# Patient Record
Sex: Female | Born: 1955 | Race: White | Hispanic: No | Marital: Married | Smoking: Former smoker
Health system: Southern US, Community
[De-identification: ages and names within clinical notes are randomized; demographics above are authoritative.]

## PROBLEM LIST (undated history)

## (undated) DIAGNOSIS — C801 Malignant (primary) neoplasm, unspecified: Secondary | ICD-10-CM

## (undated) DIAGNOSIS — R0600 Dyspnea, unspecified: Secondary | ICD-10-CM

## (undated) DIAGNOSIS — K219 Gastro-esophageal reflux disease without esophagitis: Secondary | ICD-10-CM

## (undated) DIAGNOSIS — M1990 Unspecified osteoarthritis, unspecified site: Secondary | ICD-10-CM

## (undated) DIAGNOSIS — F419 Anxiety disorder, unspecified: Secondary | ICD-10-CM

## (undated) DIAGNOSIS — I2510 Atherosclerotic heart disease of native coronary artery without angina pectoris: Secondary | ICD-10-CM

## (undated) HISTORY — PX: CARDIAC CATHETERIZATION: SHX172

## (undated) HISTORY — PX: OTHER SURGICAL HISTORY: SHX169

## (undated) HISTORY — PX: COLON SURGERY: SHX602

## (undated) HISTORY — PX: EYE SURGERY: SHX253

## (undated) HISTORY — PX: DILATION AND CURETTAGE OF UTERUS: SHX78

## (undated) HISTORY — PX: LIVER RESECTION: SHX1977

## (undated) HISTORY — PX: CHOLECYSTECTOMY: SHX55

---

## 2015-12-06 ENCOUNTER — Ambulatory Visit (INDEPENDENT_AMBULATORY_CARE_PROVIDER_SITE_OTHER): Payer: BLUE CROSS/BLUE SHIELD | Admitting: Orthopaedic Surgery

## 2015-12-06 ENCOUNTER — Ambulatory Visit (INDEPENDENT_AMBULATORY_CARE_PROVIDER_SITE_OTHER): Payer: Self-pay

## 2015-12-06 ENCOUNTER — Encounter (INDEPENDENT_AMBULATORY_CARE_PROVIDER_SITE_OTHER): Payer: Self-pay | Admitting: Orthopaedic Surgery

## 2015-12-06 DIAGNOSIS — M1612 Unilateral primary osteoarthritis, left hip: Secondary | ICD-10-CM | POA: Diagnosis not present

## 2015-12-06 DIAGNOSIS — M25552 Pain in left hip: Secondary | ICD-10-CM

## 2015-12-06 NOTE — Progress Notes (Signed)
Office Visit Note   Patient: [name]           Date of Birth: 1955/11/20           MRN: [id number] Visit Date: 12/06/2015              Requested by: Meliton Rattan, MD [address], [place] [id number] PCP: Meliton Rattan, MD   Assessment & Plan: Visit Diagnoses:  1. Pain in left hip   2. Unilateral primary osteoarthritis, left hip     Plan: [name] is definitely interested in a left total hip replacement. I showed her models and shaired with her her x-rays. We explained in detail the disease process. I had a thorough discussion of the risks and benefits of hip replacement surgery. Given her daily pain, her decreased mobility, and her decreased quality of life she does wish to have the surgery performed. We will work on getting this scheduled and then we would see her back in 2 weeks postoperative but no x-rays are needed.  Follow-Up Instructions: Return in about 1 month (around 01/05/2016) for 2 weeks post-op.   Orders:  Orders Placed This Encounter  Procedures  . XR HIP UNILAT W OR W/O PELVIS 1V LEFT   No orders of the defined types were placed in this encounter.     Procedures: No procedures performed   Clinical Data: No additional findings.   Subjective: Chief Complaint  Patient presents with  . Left Hip - Pain    Pain for a few years,worsening in the last 6 months. ROM is decreasing, pain worsens at night. Denies any groin pain. Has been told in the past she has arthritis in the hips.   Her pain is mainly at night. It does wake her up from sleep. It is 5 out of 10 on occasion. Definitely hurts in the groin area. Anti-inflammatories do help things calm down on occasion. HPI  Review of Systems Negative for chest pain shortness breath fever chills nausea and vomiting  Objective: Vital Signs: There were no vitals taken for this visit.  Physical Exam  Constitutional: She is oriented to person, place, and time. She appears  well-developed and well-nourished.  HENT:  Head: Normocephalic and atraumatic.  Eyes: EOM are normal. Pupils are equal, round, and reactive to light.  Neck: Normal range of motion. Neck supple.  Cardiovascular: Normal rate and regular rhythm.   Pulmonary/Chest: Effort normal and breath sounds normal.  Abdominal: Soft. Bowel sounds are normal.  Musculoskeletal:       Left hip: She exhibits decreased range of motion, decreased strength, tenderness and bony tenderness.  Neurological: She is alert and oriented to person, place, and time.  Skin: Skin is warm and dry.    Ortho Exam  Specialty Comments:  No specialty comments available.  Imaging: Xr Hip Unilat W Or W/o Pelvis 1v Left  Result Date: 12/06/2015 An AP pelvis and a lateral of her left hip show severe end-stage arthritis. There is joint space narrowing, periarticular osteophytes, and sclerotic changes. There is also cystic changes in the femoral head and the acetabulum.    PMFS History: There are no active problems to display for this patient.  No past medical history on file.  No family history on file.  No past surgical history on file. Social History   Occupational History  . Not on file.   Social History Main Topics  . Smoking status: Former Research scientist (life sciences)  . Smokeless tobacco: Never  Used  . Alcohol use Not on file  . Drug use: Unknown  . Sexual activity: Not on file

## 2016-01-09 ENCOUNTER — Other Ambulatory Visit (INDEPENDENT_AMBULATORY_CARE_PROVIDER_SITE_OTHER): Payer: Self-pay | Admitting: Physician Assistant

## 2016-01-09 ENCOUNTER — Encounter (HOSPITAL_COMMUNITY): Payer: Self-pay | Admitting: *Deleted

## 2016-01-09 ENCOUNTER — Encounter (HOSPITAL_COMMUNITY)
Admission: RE | Admit: 2016-01-09 | Discharge: 2016-01-09 | Disposition: A | Discharge status: Home / Self Care | Payer: BLUE CROSS/BLUE SHIELD | Source: Ambulatory Visit | Attending: Orthopaedic Surgery | Admitting: Orthopaedic Surgery

## 2016-01-09 DIAGNOSIS — Z01812 Encounter for preprocedural laboratory examination: Secondary | ICD-10-CM | POA: Diagnosis not present

## 2016-01-09 DIAGNOSIS — M1612 Unilateral primary osteoarthritis, left hip: Secondary | ICD-10-CM | POA: Insufficient documentation

## 2016-01-09 DIAGNOSIS — Z01818 Encounter for other preprocedural examination: Secondary | ICD-10-CM | POA: Diagnosis present

## 2016-01-09 HISTORY — DX: Anxiety disorder, unspecified: F41.9

## 2016-01-09 HISTORY — DX: Unspecified osteoarthritis, unspecified site: M19.90

## 2016-01-09 HISTORY — DX: Gastro-esophageal reflux disease without esophagitis: K21.9

## 2016-01-09 HISTORY — DX: Dyspnea, unspecified: R06.00

## 2016-01-09 HISTORY — DX: Malignant (primary) neoplasm, unspecified: C80.1

## 2016-01-09 HISTORY — DX: Atherosclerotic heart disease of native coronary artery without angina pectoris: I25.10

## 2016-01-09 LAB — COMPREHENSIVE METABOLIC PANEL
ALK PHOS: 67 U/L (ref 38–126)
ALT: 22 U/L (ref 14–54)
ANION GAP: 7 (ref 5–15)
AST: 24 U/L (ref 15–41)
Albumin: 4 g/dL (ref 3.5–5.0)
BILIRUBIN TOTAL: 0.9 mg/dL (ref 0.3–1.2)
BUN: 10 mg/dL (ref 6–20)
CALCIUM: 9.6 mg/dL (ref 8.9–10.3)
CO2: 26 mmol/L (ref 22–32)
Chloride: 105 mmol/L (ref 101–111)
Creatinine, Ser: 0.74 mg/dL (ref 0.44–1.00)
Glucose, Bld: 95 mg/dL (ref 65–99)
POTASSIUM: 3.8 mmol/L (ref 3.5–5.1)
Sodium: 138 mmol/L (ref 135–145)
TOTAL PROTEIN: 7 g/dL (ref 6.5–8.1)

## 2016-01-09 LAB — URINE MICROSCOPIC-ADD ON: RBC / HPF: NONE SEEN RBC/hpf (ref 0–5)

## 2016-01-09 LAB — URINALYSIS, ROUTINE W REFLEX MICROSCOPIC
Bilirubin Urine: NEGATIVE
GLUCOSE, UA: NEGATIVE mg/dL
Hgb urine dipstick: NEGATIVE
KETONES UR: NEGATIVE mg/dL
NITRITE: NEGATIVE
PROTEIN: NEGATIVE mg/dL
Specific Gravity, Urine: 1.007 (ref 1.005–1.030)
pH: 7 (ref 5.0–8.0)

## 2016-01-09 LAB — SURGICAL PCR SCREEN
MRSA, PCR: NEGATIVE
STAPHYLOCOCCUS AUREUS: POSITIVE — AB

## 2016-01-09 LAB — CBC
HEMATOCRIT: 41.8 % (ref 36.0–46.0)
HEMOGLOBIN: 13.3 g/dL (ref 12.0–15.0)
MCH: 27.2 pg (ref 26.0–34.0)
MCHC: 31.8 g/dL (ref 30.0–36.0)
MCV: 85.5 fL (ref 78.0–100.0)
Platelets: 282 10*3/uL (ref 150–400)
RBC: 4.89 MIL/uL (ref 3.87–5.11)
RDW: 15.3 % (ref 11.5–15.5)
WBC: 6.2 10*3/uL (ref 4.0–10.5)

## 2016-01-09 NOTE — Progress Notes (Signed)
Patient was diagnosed with rectal cancer back in 2009.  Had colon surgery, with part of her liver resected and now has ileostomy bag and a Port a Cath ( port was last flushed 03/2015)  All this happened in 2009-2010.  Last chemo treatment (12 in all) was 10/2008, and radiation was 02/2008.   She was experiencing some SOB with exertion, and went to see Dr. Ernesta Amble at [facility].  Have requested info from his office.  Otherwise, heart caths were normal. PCP is Leanne Bankitis @ [facility]  LOV 09/2015

## 2016-01-09 NOTE — Pre-Procedure Instructions (Signed)
    [name]  01/09/2016      [address], [place] [address] Phone: [phone] Fax: [phone]    Your procedure is scheduled on Tuesday, Dec. 12th   Report to [facility] Admitting at 10:30 AM             (posted surgery time 12:30 pm - 2:20 pm)   Call this number if you have problems the morning of surgery:  [phone]   Remember:  Do not eat food or drink liquids after midnight Monday.   Take these medicines the morning of surgery with A SIP OF WATER : NONE              4-5 DAYS PRIOR TO SURGERY, STOP TAKING HERBAL SUPPLEMENTS, VITAMINS, BLOOD THINNERS, ANTI-INFLAMMATORIES   Do not wear jewelry, make-up or nail polish.  Do not wear lotions, powders, or perfumes, or deoderant.   Do not shave 48 hours prior to surgery.    Do not bring valuables to the hospital.  [facility] is not responsible for any belongings or valuables.  Contacts, dentures or bridgework may not be worn into surgery.  Leave your suitcase in the car.  After surgery it may be brought to your room.  For patients admitted to the hospital, discharge time will be determined by your treatment team.  Please read over the following fact sheets that you were given. Pain Booklet, MRSA Information and Surgical Site Infection Prevention

## 2016-01-10 ENCOUNTER — Encounter (HOSPITAL_COMMUNITY): Payer: Self-pay

## 2016-01-10 NOTE — Progress Notes (Signed)
Anesthesia Chart Review: Patient is a 60 year old female scheduled for left THA, anterior approach on 01/17/16 by Dr. Jean Rosenthal.  History includes former smoker (quit '85), exertional dyspnea, anxiety, GERD, arthritis, stage IV rectal cancer s/p chemoradiation '09 and s/p low anterior resection and resection of liver mets, ileostomy '10, Port-a-cath, cholecystectomy '10. She had mild CAD (30% LAD, RCA) by 2016 cath.   PCP is Dr. Byrd Hesselbach Bankaitis at [facility].   Cardiologist is Dr. Ernesta Amble with [facility], last visit 12/27/15.  Meds include ASA 81 mg.  BP 121/74   Pulse (!) 58   Temp 36.4 C (Oral)   Resp 20   Ht 5\' 3"  (1.6 m)   Wt 199 lb 15.3 oz (90.7 kg)   SpO2 100%   BMI 35.42 kg/m   12/27/15 EKG ([facility]): NSR.  10/22/14 Cardiac Cath ([facility]; done following 10/12/14 positive ETT): Impressions:  1. No significant CAD (normal LM, normal LCX/Ramus, 30% proximal LAD, 30% ostium RCA). Right dominance.  2. Elevated LV filling pressures (LVEDP 17-20 mmHg).  3. Absent for aortic stenosis.  03/29/15 CT Chest/abd/pelvis ([facility]; Care Everywhere): Impression: 1. No evidence for metastatic disease in the chest, abdomen or pelvis.  Preoperative labs noted. UA trace leukocytes, but negative nitrites.   If no acute changes then I would anticipate that she could proceed as planned.  George Hugh [facility] Short Stay Center/Anesthesiology Phone [phone] 01/10/2016 4:20 PM

## 2016-01-16 MED ORDER — TRANEXAMIC ACID 1000 MG/10ML IV SOLN
1000.0000 mg | INTRAVENOUS | Status: AC
  Administered 2016-01-17: 1000 mg via INTRAVENOUS
  Filled 2016-01-16: qty 10

## 2016-01-16 MED ORDER — CEFAZOLIN SODIUM-DEXTROSE 2-4 GM/100ML-% IV SOLN
2.0000 g | INTRAVENOUS | Status: AC
  Administered 2016-01-17: 2 g via INTRAVENOUS
  Filled 2016-01-16: qty 100

## 2016-01-17 ENCOUNTER — Encounter (HOSPITAL_COMMUNITY)
Admission: RE | Disposition: A | Discharge status: Home Health | Payer: Self-pay | Source: Ambulatory Visit | Attending: Orthopaedic Surgery

## 2016-01-17 ENCOUNTER — Inpatient Hospital Stay (HOSPITAL_COMMUNITY): Payer: BLUE CROSS/BLUE SHIELD

## 2016-01-17 ENCOUNTER — Inpatient Hospital Stay (HOSPITAL_COMMUNITY): Payer: BLUE CROSS/BLUE SHIELD | Admitting: Vascular Surgery

## 2016-01-17 ENCOUNTER — Inpatient Hospital Stay (HOSPITAL_COMMUNITY)
Admission: RE | Admit: 2016-01-17 | Discharge: 2016-01-20 | DRG: 470 | Disposition: A | Discharge status: Home Health | Payer: BLUE CROSS/BLUE SHIELD | Source: Ambulatory Visit | Attending: Orthopaedic Surgery | Admitting: Orthopaedic Surgery

## 2016-01-17 ENCOUNTER — Encounter (HOSPITAL_COMMUNITY): Payer: Self-pay | Admitting: *Deleted

## 2016-01-17 ENCOUNTER — Inpatient Hospital Stay (HOSPITAL_COMMUNITY): Payer: BLUE CROSS/BLUE SHIELD | Admitting: Anesthesiology

## 2016-01-17 DIAGNOSIS — K219 Gastro-esophageal reflux disease without esophagitis: Secondary | ICD-10-CM | POA: Diagnosis present

## 2016-01-17 DIAGNOSIS — Z419 Encounter for procedure for purposes other than remedying health state, unspecified: Secondary | ICD-10-CM

## 2016-01-17 DIAGNOSIS — Z87891 Personal history of nicotine dependence: Secondary | ICD-10-CM

## 2016-01-17 DIAGNOSIS — Z85048 Personal history of other malignant neoplasm of rectum, rectosigmoid junction, and anus: Secondary | ICD-10-CM | POA: Diagnosis not present

## 2016-01-17 DIAGNOSIS — I2510 Atherosclerotic heart disease of native coronary artery without angina pectoris: Secondary | ICD-10-CM | POA: Diagnosis present

## 2016-01-17 DIAGNOSIS — Z7982 Long term (current) use of aspirin: Secondary | ICD-10-CM | POA: Diagnosis not present

## 2016-01-17 DIAGNOSIS — M1612 Unilateral primary osteoarthritis, left hip: Secondary | ICD-10-CM | POA: Diagnosis present

## 2016-01-17 DIAGNOSIS — Z96642 Presence of left artificial hip joint: Secondary | ICD-10-CM

## 2016-01-17 DIAGNOSIS — Z79899 Other long term (current) drug therapy: Secondary | ICD-10-CM

## 2016-01-17 HISTORY — PX: TOTAL HIP ARTHROPLASTY: SHX124

## 2016-01-17 SURGERY — ARTHROPLASTY, HIP, TOTAL, ANTERIOR APPROACH
Anesthesia: Spinal | Laterality: Left

## 2016-01-17 MED ORDER — CHLORHEXIDINE GLUCONATE 4 % EX LIQD: 60.0000 mL | Freq: Once | CUTANEOUS | Status: DC

## 2016-01-17 MED ORDER — METHOCARBAMOL 500 MG PO TABS
500.0000 mg | ORAL_TABLET | Freq: Four times a day (QID) | ORAL | Status: DC | PRN
  Administered 2016-01-17 – 2016-01-20 (×6): 500 mg via ORAL
  Filled 2016-01-17 (×6): qty 1

## 2016-01-17 MED ORDER — ONDANSETRON HCL 4 MG/2ML IJ SOLN
INTRAMUSCULAR | Status: AC
  Filled 2016-01-17: qty 2

## 2016-01-17 MED ORDER — ADULT MULTIVITAMIN W/MINERALS CH
1.0000 | ORAL_TABLET | Freq: Every evening | ORAL | Status: DC
  Administered 2016-01-17 – 2016-01-19 (×3): 1 via ORAL
  Filled 2016-01-17 (×3): qty 1

## 2016-01-17 MED ORDER — SODIUM CHLORIDE 0.9 % IV SOLN
INTRAVENOUS | Status: DC
  Administered 2016-01-17: 16:00:00 via INTRAVENOUS

## 2016-01-17 MED ORDER — METHOCARBAMOL 1000 MG/10ML IJ SOLN
500.0000 mg | Freq: Four times a day (QID) | INTRAVENOUS | Status: DC | PRN
  Filled 2016-01-17: qty 5

## 2016-01-17 MED ORDER — PHENYLEPHRINE HCL 10 MG/ML IJ SOLN
INTRAVENOUS | Status: DC | PRN
  Administered 2016-01-17: 25 ug/min via INTRAVENOUS

## 2016-01-17 MED ORDER — MIDAZOLAM HCL 2 MG/2ML IJ SOLN
INTRAMUSCULAR | Status: AC
  Filled 2016-01-17: qty 2

## 2016-01-17 MED ORDER — HYDROMORPHONE HCL 1 MG/ML IJ SOLN
0.2500 mg | INTRAMUSCULAR | Status: DC | PRN
  Administered 2016-01-17 (×2): 0.5 mg via INTRAVENOUS

## 2016-01-17 MED ORDER — PROPOFOL 10 MG/ML IV BOLUS
INTRAVENOUS | Status: AC
  Filled 2016-01-17: qty 20

## 2016-01-17 MED ORDER — SODIUM CHLORIDE 0.9 % IR SOLN
Status: DC | PRN
  Administered 2016-01-17: 3000 mL

## 2016-01-17 MED ORDER — MIDAZOLAM HCL 5 MG/5ML IJ SOLN
INTRAMUSCULAR | Status: DC | PRN
Start: 2016-01-17 — End: 2016-01-17
  Administered 2016-01-17: 2 mg via INTRAVENOUS

## 2016-01-17 MED ORDER — EPHEDRINE 5 MG/ML INJ
INTRAVENOUS | Status: AC
  Filled 2016-01-17: qty 10

## 2016-01-17 MED ORDER — PHENYLEPHRINE HCL 10 MG/ML IJ SOLN
INTRAMUSCULAR | Status: DC | PRN
  Administered 2016-01-17: 120 ug via INTRAVENOUS
  Administered 2016-01-17: 80 ug via INTRAVENOUS

## 2016-01-17 MED ORDER — FENTANYL CITRATE (PF) 100 MCG/2ML IJ SOLN
INTRAMUSCULAR | Status: DC | PRN
  Administered 2016-01-17: 50 ug via INTRAVENOUS

## 2016-01-17 MED ORDER — 0.9 % SODIUM CHLORIDE (POUR BTL) OPTIME
TOPICAL | Status: DC | PRN
  Administered 2016-01-17: 1000 mL

## 2016-01-17 MED ORDER — ACETAMINOPHEN 325 MG PO TABS
650.0000 mg | ORAL_TABLET | Freq: Four times a day (QID) | ORAL | Status: DC | PRN
  Administered 2016-01-18 – 2016-01-20 (×6): 650 mg via ORAL
  Filled 2016-01-17 (×7): qty 2

## 2016-01-17 MED ORDER — CEFAZOLIN IN D5W 1 GM/50ML IV SOLN
1.0000 g | Freq: Four times a day (QID) | INTRAVENOUS | Status: AC
  Administered 2016-01-17 – 2016-01-18 (×2): 1 g via INTRAVENOUS
  Filled 2016-01-17 (×2): qty 50

## 2016-01-17 MED ORDER — METOCLOPRAMIDE HCL 5 MG PO TABS: 5.0000 mg | ORAL_TABLET | Freq: Three times a day (TID) | ORAL | Status: DC | PRN

## 2016-01-17 MED ORDER — PROPOFOL 10 MG/ML IV BOLUS
INTRAVENOUS | Status: DC | PRN
  Administered 2016-01-17: 20 mg via INTRAVENOUS
  Administered 2016-01-17: 10 mg via INTRAVENOUS
  Administered 2016-01-17: 20 mg via INTRAVENOUS

## 2016-01-17 MED ORDER — ONDANSETRON HCL 4 MG/2ML IJ SOLN: 4.0000 mg | Freq: Four times a day (QID) | INTRAMUSCULAR | Status: DC | PRN

## 2016-01-17 MED ORDER — DOCUSATE SODIUM 100 MG PO CAPS
100.0000 mg | ORAL_CAPSULE | Freq: Two times a day (BID) | ORAL | Status: DC
  Administered 2016-01-17 – 2016-01-19 (×5): 100 mg via ORAL
  Filled 2016-01-17 (×5): qty 1

## 2016-01-17 MED ORDER — HYDROMORPHONE HCL 1 MG/ML IJ SOLN
INTRAMUSCULAR | Status: AC
  Filled 2016-01-17: qty 0.5

## 2016-01-17 MED ORDER — OXYCODONE HCL 5 MG PO TABS
5.0000 mg | ORAL_TABLET | ORAL | Status: DC | PRN
  Administered 2016-01-17 – 2016-01-20 (×19): 10 mg via ORAL
  Filled 2016-01-17 (×19): qty 2

## 2016-01-17 MED ORDER — PHENOL 1.4 % MT LIQD: 1.0000 | OROMUCOSAL | Status: DC | PRN

## 2016-01-17 MED ORDER — MENTHOL 3 MG MT LOZG: 1.0000 | LOZENGE | OROMUCOSAL | Status: DC | PRN

## 2016-01-17 MED ORDER — ACETAMINOPHEN 650 MG RE SUPP: 650.0000 mg | Freq: Four times a day (QID) | RECTAL | Status: DC | PRN

## 2016-01-17 MED ORDER — PROMETHAZINE HCL 25 MG/ML IJ SOLN: 6.2500 mg | INTRAMUSCULAR | Status: DC | PRN

## 2016-01-17 MED ORDER — ONDANSETRON HCL 4 MG/2ML IJ SOLN
INTRAMUSCULAR | Status: DC | PRN
  Administered 2016-01-17: 4 mg via INTRAVENOUS

## 2016-01-17 MED ORDER — HYDROMORPHONE HCL 2 MG/ML IJ SOLN
0.5000 mg | INTRAMUSCULAR | Status: DC | PRN
  Administered 2016-01-17 (×2): 0.5 mg via INTRAVENOUS
  Filled 2016-01-17 (×2): qty 1

## 2016-01-17 MED ORDER — ONDANSETRON HCL 4 MG PO TABS
4.0000 mg | ORAL_TABLET | Freq: Four times a day (QID) | ORAL | Status: DC | PRN
  Administered 2016-01-18: 4 mg via ORAL
  Filled 2016-01-17: qty 1

## 2016-01-17 MED ORDER — DIPHENHYDRAMINE HCL 12.5 MG/5ML PO ELIX: 12.5000 mg | ORAL_SOLUTION | ORAL | Status: DC | PRN

## 2016-01-17 MED ORDER — ALUM & MAG HYDROXIDE-SIMETH 200-200-20 MG/5ML PO SUSP: 30.0000 mL | ORAL | Status: DC | PRN

## 2016-01-17 MED ORDER — PHENYLEPHRINE 40 MCG/ML (10ML) SYRINGE FOR IV PUSH (FOR BLOOD PRESSURE SUPPORT)
PREFILLED_SYRINGE | INTRAVENOUS | Status: AC
  Filled 2016-01-17: qty 10

## 2016-01-17 MED ORDER — EPHEDRINE SULFATE 50 MG/ML IJ SOLN
INTRAMUSCULAR | Status: DC | PRN
  Administered 2016-01-17: 15 mg via INTRAVENOUS

## 2016-01-17 MED ORDER — PROPOFOL 500 MG/50ML IV EMUL
INTRAVENOUS | Status: DC | PRN
  Administered 2016-01-17: 75 ug/kg/min via INTRAVENOUS

## 2016-01-17 MED ORDER — FENTANYL CITRATE (PF) 100 MCG/2ML IJ SOLN
INTRAMUSCULAR | Status: AC
  Filled 2016-01-17: qty 2

## 2016-01-17 MED ORDER — ASPIRIN 81 MG PO CHEW
81.0000 mg | CHEWABLE_TABLET | Freq: Two times a day (BID) | ORAL | Status: DC
  Administered 2016-01-17 – 2016-01-19 (×5): 81 mg via ORAL
  Filled 2016-01-17 (×5): qty 1

## 2016-01-17 MED ORDER — LACTATED RINGERS IV SOLN
INTRAVENOUS | Status: DC | PRN
  Administered 2016-01-17 (×2): via INTRAVENOUS

## 2016-01-17 MED ORDER — METOCLOPRAMIDE HCL 5 MG/ML IJ SOLN: 5.0000 mg | Freq: Three times a day (TID) | INTRAMUSCULAR | Status: DC | PRN

## 2016-01-17 MED ORDER — LACTATED RINGERS IV SOLN
Freq: Once | INTRAVENOUS | Status: AC
  Administered 2016-01-17: 11:00:00 via INTRAVENOUS

## 2016-01-17 MED ORDER — LIDOCAINE 2% (20 MG/ML) 5 ML SYRINGE
INTRAMUSCULAR | Status: AC
  Filled 2016-01-17: qty 5

## 2016-01-17 SURGICAL SUPPLY — 50 items
BENZOIN TINCTURE PRP APPL 2/3 (GAUZE/BANDAGES/DRESSINGS) ×3 IMPLANT
BLADE SAW SGTL 18X1.27X75 (BLADE) ×2 IMPLANT
BLADE SAW SGTL 18X1.27X75MM (BLADE) ×1
BLADE SURG ROTATE 9660 (MISCELLANEOUS) IMPLANT
CAPT HIP TOTAL 2 ×3 IMPLANT
CELLS DAT CNTRL 66122 CELL SVR (MISCELLANEOUS) ×1 IMPLANT
CLOSURE WOUND 1/2 X4 (GAUZE/BANDAGES/DRESSINGS) ×1
COVER SURGICAL LIGHT HANDLE (MISCELLANEOUS) ×3 IMPLANT
DRAPE C-ARM 42X72 X-RAY (DRAPES) ×3 IMPLANT
DRAPE STERI IOBAN 125X83 (DRAPES) ×3 IMPLANT
DRAPE U-SHAPE 47X51 STRL (DRAPES) ×9 IMPLANT
DRSG AQUACEL AG ADV 3.5X10 (GAUZE/BANDAGES/DRESSINGS) ×3 IMPLANT
DURAPREP 26ML APPLICATOR (WOUND CARE) ×3 IMPLANT
ELECT BLADE 4.0 EZ CLEAN MEGAD (MISCELLANEOUS) ×3
ELECT BLADE 6.5 EXT (BLADE) IMPLANT
ELECT REM PT RETURN 9FT ADLT (ELECTROSURGICAL) ×3
ELECTRODE BLDE 4.0 EZ CLN MEGD (MISCELLANEOUS) ×1 IMPLANT
ELECTRODE REM PT RTRN 9FT ADLT (ELECTROSURGICAL) ×1 IMPLANT
FACESHIELD WRAPAROUND (MASK) ×6 IMPLANT
GLOVE BIOGEL PI IND STRL 8 (GLOVE) ×2 IMPLANT
GLOVE BIOGEL PI INDICATOR 8 (GLOVE) ×4
GLOVE ECLIPSE 8.0 STRL XLNG CF (GLOVE) ×3 IMPLANT
GLOVE ORTHO TXT STRL SZ7.5 (GLOVE) ×6 IMPLANT
GOWN STRL REUS W/ TWL LRG LVL3 (GOWN DISPOSABLE) ×2 IMPLANT
GOWN STRL REUS W/ TWL XL LVL3 (GOWN DISPOSABLE) ×2 IMPLANT
GOWN STRL REUS W/TWL LRG LVL3 (GOWN DISPOSABLE) ×4
GOWN STRL REUS W/TWL XL LVL3 (GOWN DISPOSABLE) ×4
HANDPIECE INTERPULSE COAX TIP (DISPOSABLE) ×2
KIT BASIN OR (CUSTOM PROCEDURE TRAY) ×3 IMPLANT
KIT ROOM TURNOVER OR (KITS) ×3 IMPLANT
MANIFOLD NEPTUNE II (INSTRUMENTS) ×3 IMPLANT
NS IRRIG 1000ML POUR BTL (IV SOLUTION) ×3 IMPLANT
PACK TOTAL JOINT (CUSTOM PROCEDURE TRAY) ×3 IMPLANT
PAD ARMBOARD 7.5X6 YLW CONV (MISCELLANEOUS) ×3 IMPLANT
RTRCTR WOUND ALEXIS 18CM MED (MISCELLANEOUS) ×3
SET HNDPC FAN SPRY TIP SCT (DISPOSABLE) ×1 IMPLANT
STAPLER VISISTAT 35W (STAPLE) IMPLANT
STRIP CLOSURE SKIN 1/2X4 (GAUZE/BANDAGES/DRESSINGS) ×2 IMPLANT
SUT ETHIBOND NAB CT1 #1 30IN (SUTURE) ×3 IMPLANT
SUT MNCRL AB 4-0 PS2 18 (SUTURE) ×3 IMPLANT
SUT VIC AB 0 CT1 27 (SUTURE) ×2
SUT VIC AB 0 CT1 27XBRD ANBCTR (SUTURE) ×1 IMPLANT
SUT VIC AB 1 CT1 27 (SUTURE) ×2
SUT VIC AB 1 CT1 27XBRD ANBCTR (SUTURE) ×1 IMPLANT
SUT VIC AB 2-0 CT1 27 (SUTURE) ×2
SUT VIC AB 2-0 CT1 TAPERPNT 27 (SUTURE) ×1 IMPLANT
TOWEL OR 17X24 6PK STRL BLUE (TOWEL DISPOSABLE) ×3 IMPLANT
TOWEL OR 17X26 10 PK STRL BLUE (TOWEL DISPOSABLE) ×3 IMPLANT
TRAY CATH 16FR W/PLASTIC CATH (SET/KITS/TRAYS/PACK) IMPLANT
TRAY FOLEY CATH 16FRSI W/METER (SET/KITS/TRAYS/PACK) ×3 IMPLANT

## 2016-01-17 NOTE — Anesthesia Postprocedure Evaluation (Signed)
Anesthesia Post Note  Patient: [name]  Procedure(s) Performed: Procedure(s) (LRB): LEFT TOTAL HIP ARTHROPLASTY ANTERIOR APPROACH (Left)  Patient location during evaluation: PACU Anesthesia Type: Spinal Level of consciousness: oriented and awake and alert Pain management: pain level controlled Vital Signs Assessment: post-procedure vital signs reviewed and stable Respiratory status: spontaneous breathing, respiratory function stable and patient connected to nasal cannula oxygen Cardiovascular status: blood pressure returned to baseline and stable Postop Assessment: no headache and no backache Anesthetic complications: no    Last Vitals:  Vitals:   01/17/16 1430 01/17/16 1445  BP: (!) 92/59 (!) 97/54  Pulse: 73 66  Resp: 19 16  Temp:      Last Pain:  Vitals:   01/17/16 1445  TempSrc:   PainSc: 0-No pain    LLE Motor Response: No movement due to regional block (01/17/16 1445) LLE Sensation: No sensation (absent) (01/17/16 1445) RLE Motor Response: No movement due to regional block (01/17/16 1445) RLE Sensation: No sensation (absent) (01/17/16 1445) L Sensory Level: L2-Upper inner thigh, upper buttock (01/17/16 1445) R Sensory Level: L2-Upper inner thigh, upper buttock (01/17/16 1445)  Oliver Heitzenrater S

## 2016-01-17 NOTE — Anesthesia Preprocedure Evaluation (Addendum)
Anesthesia Evaluation  Patient identified by MRN, date of birth, ID band Patient awake    Reviewed: Allergy & Precautions, NPO status , Patient's Chart, lab work & pertinent test results  Airway Mallampati: III  TM Distance: >3 FB Neck ROM: Full    Dental no notable dental hx. (+) Teeth Intact, Dental Advisory Given   Pulmonary neg pulmonary ROS, former smoker,    Pulmonary exam normal breath sounds clear to auscultation       Cardiovascular + CAD  negative cardio ROS Normal cardiovascular exam Rhythm:Regular Rate:Normal  Cath 10/2014 30% RCA/LAD   Neuro/Psych negative neurological ROS  negative psych ROS   GI/Hepatic Neg liver ROS, GERD  Medicated and Controlled,Hx colon cancer Patient currently has an ileostomy.   Endo/Other  negative endocrine ROS  Renal/GU negative Renal ROS  negative genitourinary   Musculoskeletal negative musculoskeletal ROS (+)   Abdominal   Peds negative pediatric ROS (+)  Hematology negative hematology ROS (+)   Anesthesia Other Findings   Reproductive/Obstetrics negative OB ROS                           Anesthesia Physical Anesthesia Plan  ASA: II  Anesthesia Plan: Spinal   Post-op Pain Management:    Induction: Intravenous  Airway Management Planned: Simple Face Mask  Additional Equipment:   Intra-op Plan:   Post-operative Plan:   Informed Consent: I have reviewed the patients History and Physical, chart, labs and discussed the procedure including the risks, benefits and alternatives for the proposed anesthesia with the patient or authorized representative who has indicated his/her understanding and acceptance.   Dental advisory given  Plan Discussed with: CRNA and Surgeon  Anesthesia Plan Comments:         Anesthesia Quick Evaluation

## 2016-01-17 NOTE — Brief Op Note (Signed)
01/17/2016  1:47 PM  PATIENT:  [name]  60 y.o. female  PRE-OPERATIVE DIAGNOSIS:  Osteoarthritis left hip  POST-OPERATIVE DIAGNOSIS:  Osteoarthritis left hip  PROCEDURE:  Procedure(s): LEFT TOTAL HIP ARTHROPLASTY ANTERIOR APPROACH (Left)  SURGEON:  Surgeon(s) and Role:    * Mcarthur Rossetti, MD - Primary  PHYSICIAN ASSISTANT: Benita Stabile, PA-C  ANESTHESIA:   spinal  EBL:  Total I/O In: 1000 [I.V.:1000] Out: 350 [Urine:150; Blood:200]  COUNTS:  YES  DICTATION: .Other Dictation: Dictation Number [id number]  PLAN OF CARE: Admit to inpatient   PATIENT DISPOSITION:  PACU - hemodynamically stable.   Delay start of Pharmacological VTE agent (>24hrs) due to surgical blood loss or risk of bleeding: no

## 2016-01-17 NOTE — Anesthesia Procedure Notes (Signed)
Procedure Name: MAC Date/Time: 01/17/2016 12:25 PM Performed by: Jenne Campus Pre-anesthesia Checklist: Patient identified, Emergency Drugs available, Suction available, Patient being monitored and Timeout performed Oxygen Delivery Method: Simple face mask

## 2016-01-17 NOTE — Transfer of Care (Signed)
Immediate Anesthesia Transfer of Care Note  Patient: [name]  Procedure(s) Performed: Procedure(s): LEFT TOTAL HIP ARTHROPLASTY ANTERIOR APPROACH (Left)  Patient Location: PACU  Anesthesia Type:MAC and Spinal  Level of Consciousness: awake, alert , oriented and patient cooperative  Airway & Oxygen Therapy: Patient Spontanous Breathing and Patient connected to face mask oxygen  Post-op Assessment: Report given to RN and Post -op Vital signs reviewed and stable  Post vital signs: Reviewed  Last Vitals:  Vitals:   01/17/16 1011 01/17/16 1420  BP: 129/83 (!) (P) 86/49  Pulse: 70   Resp: 20   Temp: 36.6 C (P) 36.5 C    Last Pain:  Vitals:   01/17/16 1034  TempSrc:   PainSc: 2          Complications: No apparent anesthesia complications

## 2016-01-17 NOTE — Anesthesia Procedure Notes (Signed)
Spinal  Patient location during procedure: OR Start time: 01/17/2016 12:25 PM End time: 01/17/2016 12:35 PM Staffing Anesthesiologist: Kenetra Hildenbrand, Iona Beard Performed: anesthesiologist  Preanesthetic Checklist Completed: patient identified, site marked, surgical consent, pre-op evaluation, timeout performed, IV checked, risks and benefits discussed and monitors and equipment checked Spinal Block Patient position: sitting Prep: Betadine Patient monitoring: heart rate, continuous pulse ox and blood pressure Injection technique: single-shot Needle Needle type: Sprotte  Needle gauge: 24 G Needle length: 9 cm Additional Notes Expiration date of kit checked and confirmed. Patient tolerated procedure well, without complications.

## 2016-01-17 NOTE — H&P (Signed)
TOTAL HIP ADMISSION H&P  Patient is admitted for left total hip arthroplasty.  Subjective:  Chief Complaint: left hip pain  HPI: [name], 60 y.o. female, has a history of pain and functional disability in the left hip(s) due to arthritis and patient has failed non-surgical conservative treatments for greater than 12 weeks to include NSAID's and/or analgesics, corticosteriod injections, flexibility and strengthening excercises, use of assistive devices, weight reduction as appropriate and activity modification.  Onset of symptoms was gradual starting 2 years ago with gradually worsening course since that time.The patient noted no past surgery on the left hip(s).  Patient currently rates pain in the left hip at 10 out of 10 with activity. Patient has night pain, worsening of pain with activity and weight bearing, pain that interfers with activities of daily living and pain with passive range of motion. Patient has evidence of subchondral cysts, subchondral sclerosis, periarticular osteophytes and joint space narrowing by imaging studies. This condition presents safety issues increasing the risk of falls.  There is no current active infection.  Patient Active Problem List   Diagnosis Date Noted  . Unilateral primary osteoarthritis, left hip 01/17/2016   Past Medical History:  Diagnosis Date  . Anxiety    treated in the past...none now  . Arthritis   . Cancer [facility])    rectal cancer  dx 2009  . Coronary artery disease    30% LAD, RCA 10/2014 cath  . Dyspnea    with extreme exertion  . GERD (gastroesophageal reflux disease)    occasional    Past Surgical History:  Procedure Laterality Date  . CARDIAC CATHETERIZATION     10/22/14: 30% proximal LAD, 30% ostial RCA ([facility])  . CHOLECYSTECTOMY     thinks it was removed with cancer surgery  . COLON SURGERY     rectal cancer with removal of 2009  . DILATION AND CURETTAGE OF UTERUS    . EYE SURGERY    . LIVER RESECTION     along with  rectal surgery, portion of liver was resected  . PORT A CATH insertion Right    placed back in 2009    Prescriptions Prior to Admission  Medication Sig Dispense Refill Last Dose  . CALCIUM PO Take 1 tablet by mouth every evening.   01/16/2016 at Unknown time  . ibuprofen (ADVIL,MOTRIN) 200 MG tablet Take 400 mg by mouth every 8 (eight) hours as needed (for pain.).   Past Week at Unknown time  . Multiple Vitamin (MULTIVITAMIN WITH MINERALS) TABS tablet Take 1 tablet by mouth every evening.   Past Week at Unknown time  . Ascorbic Acid (VITAMIN C PO) Take 1 tablet by mouth every evening.   More than a month at Unknown time  . aspirin EC 81 MG tablet Take 162 mg by mouth every evening.   More than a month at Unknown time  . Cholecalciferol (VITAMIN D PO) Take 1 tablet by mouth every evening.   More than a month at Unknown time  . Probiotic Product (PROBIOTIC PO) Take 1 capsule by mouth every evening.   More than a month at Unknown time  . SELENIUM PO Take 1 tablet by mouth every evening.   More than a month at Unknown time  . spironolactone (ALDACTONE) 25 MG tablet   2 12/26/2015   Allergies  Allergen Reactions  . No Known Allergies     Social History  Substance Use Topics  . Smoking status: Former Smoker    Packs/day: 0.50  Years: 5.00    Types: Cigarettes    Quit date: 01/09/1984  . Smokeless tobacco: Never Used  . Alcohol use 1.8 oz/week    3 Glasses of wine per week    History reviewed. No pertinent family history.   Review of Systems  Musculoskeletal: Positive for joint pain.  All other systems reviewed and are negative.   Objective:  Physical Exam  Constitutional: She is oriented to person, place, and time. She appears well-developed and well-nourished.  HENT:  Head: Normocephalic and atraumatic.  Eyes: EOM are normal. Pupils are equal, round, and reactive to light.  Neck: Normal range of motion. Neck supple.  Cardiovascular: Normal rate and regular rhythm.    Respiratory: Effort normal and breath sounds normal.  GI: Soft. Bowel sounds are normal.  Musculoskeletal:       Left hip: She exhibits decreased range of motion, decreased strength, tenderness and bony tenderness.  Neurological: She is alert and oriented to person, place, and time.  Skin: Skin is warm and dry.  Psychiatric: She has a normal mood and affect.    Vital signs in last 24 hours: Temp:  [97.8 F (36.6 C)] 97.8 F (36.6 C) (12/12 1011) Pulse Rate:  [70] 70 (12/12 1011) Resp:  [20] 20 (12/12 1011) BP: (129)/(83) 129/83 (12/12 1011) SpO2:  [100 %] 100 % (12/12 1011) Weight:  [199 lb (90.3 kg)] 199 lb (90.3 kg) (12/12 1011)  Labs:   Estimated body mass index is 35.25 kg/m as calculated from the following:   Height as of 01/09/16: 5\' 3"  (1.6 m).   Weight as of this encounter: 199 lb (90.3 kg).   Imaging Review Plain radiographs demonstrate severe degenerative joint disease of the left hip(s). The bone quality appears to be good for age and reported activity level.  Assessment/Plan:  End stage arthritis, left hip(s)  The patient history, physical examination, clinical judgement of the provider and imaging studies are consistent with end stage degenerative joint disease of the left hip(s) and total hip arthroplasty is deemed medically necessary. The treatment options including medical management, injection therapy, arthroscopy and arthroplasty were discussed at length. The risks and benefits of total hip arthroplasty were presented and reviewed. The risks due to aseptic loosening, infection, stiffness, dislocation/subluxation,  thromboembolic complications and other imponderables were discussed.  The patient acknowledged the explanation, agreed to proceed with the plan and consent was signed. Patient is being admitted for inpatient treatment for surgery, pain control, PT, OT, prophylactic antibiotics, VTE prophylaxis, progressive ambulation and ADL's and discharge planning.The  patient is planning to be discharged home with home health services

## 2016-01-18 ENCOUNTER — Encounter (HOSPITAL_COMMUNITY): Payer: Self-pay

## 2016-01-18 LAB — BASIC METABOLIC PANEL
ANION GAP: 5 (ref 5–15)
BUN: 6 mg/dL (ref 6–20)
CALCIUM: 8.7 mg/dL — AB (ref 8.9–10.3)
CO2: 28 mmol/L (ref 22–32)
Chloride: 102 mmol/L (ref 101–111)
Creatinine, Ser: 0.74 mg/dL (ref 0.44–1.00)
GFR calc Af Amer: >60 mL/min (ref >60)
GFR calc non Af Amer: >60 mL/min (ref >60)
GLUCOSE: 111 mg/dL — AB (ref 65–99)
Potassium: 4.7 mmol/L (ref 3.5–5.1)
Sodium: 135 mmol/L (ref 135–145)

## 2016-01-18 LAB — CBC
HEMATOCRIT: 33.3 % — AB (ref 36.0–46.0)
Hemoglobin: 10.6 g/dL — ABNORMAL LOW (ref 12.0–15.0)
MCH: 26.9 pg (ref 26.0–34.0)
MCHC: 31.8 g/dL (ref 30.0–36.0)
MCV: 84.5 fL (ref 78.0–100.0)
Platelets: 222 10*3/uL (ref 150–400)
RBC: 3.94 MIL/uL (ref 3.87–5.11)
RDW: 14.9 % (ref 11.5–15.5)
WBC: 6.8 10*3/uL (ref 4.0–10.5)

## 2016-01-18 NOTE — Progress Notes (Signed)
Physical Therapy Evaluation Patient Details Name: [name] MRN: [id number] DOB: 1955-11-20 Today's Date: 01/18/2016   History of Present Illness  Pt is a 60 y/o female who presented for L THA on 01/17/16 by Dr. Jean Rosenthal. 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 She had mild CAD by 2016 cath.   Clinical Impression  PTA, pt was independently ambulating without an assistive device. Pt lives with daughter who works as a Marine scientist but will be working full time after pt's discharge. Pt notes that she has siblings that may be able to help. Pt able to transfer out of bed with min assist and ambulate with min guard for safety. Pt limited by pain today and was only able to ambulate to door. Pt vomited before her walk x2 and after her walk x1. RN notified on vomiting status. Pt will benefit from HHPT to maximize strength and mobility deficits to return to PLOF. PT will continue to follow acutely.     Follow Up Recommendations Home health PT;Supervision for mobility/OOB    Equipment Recommendations  Rolling walker with 5" wheels    Recommendations for Other Services       Precautions / Restrictions Precautions Precautions: None Restrictions Weight Bearing Restrictions: Yes LLE Weight Bearing: Weight bearing as tolerated      Mobility  Bed Mobility Overal bed mobility: Needs Assistance Bed Mobility: Rolling;Sidelying to Sit Rolling: Min guard Sidelying to sit: Min assist       General bed mobility comments: Min assist for LE mobility into and out of bed and trunk elevation   Transfers Overall transfer level: Needs assistance Equipment used: Rolling walker (2 wheeled) Transfers: Sit to/from Stand Sit to Stand: Min guard         General transfer comment: Min guard for safety    Ambulation/Gait Ambulation/Gait assistance: Min guard Ambulation Distance (Feet): 30 Feet Assistive device: Rolling walker (2 wheeled) Gait Pattern/deviations: Step-to pattern;Decreased stride length;Decreased stance time - left;Decreased weight shift to left;Antalgic Gait velocity: decreased Gait velocity interpretation: Below normal speed for age/gender General Gait Details: pt with antalgic gait 2/2 pain in L hip upon weightbearing on L.   Stairs            Wheelchair Mobility    Modified Rankin (Stroke Patients Only)       Balance Overall balance assessment: Needs assistance Sitting-balance support: No upper extremity supported;Feet supported Sitting balance-Leahy Scale: Good     Standing balance support: No upper extremity supported;During functional activity Standing balance-Leahy Scale: Fair Standing balance comment: Pt able to stand and assist with donning underwear in standing without UE Support                             Pertinent Vitals/Pain Pain Assessment: 0-10 Pain Score: 6  Pain Location: L hip Pain Descriptors / Indicators: Aching Pain Intervention(s): Limited activity within patient's tolerance;Monitored during session;Repositioned;Patient requesting pain meds-RN notified;Ice applied    Home Living Family/patient expects to be discharged to:: Private residence Living Arrangements:  (adult daughter Investment banker, corporate and works full time); 2 sisters nearby) Available Help at Discharge: Family;Available PRN/intermittently Type of Home: House Home Access: Stairs to enter (front door 5 steps with rail B) Entrance Stairs-Rails: None Entrance Stairs-Number of Steps: 3 Home Layout: Two level (bedroom downstairs) Home Equipment: Walker - 2  wheels;Cane - single point;Shower seat - built in;Hand held shower head      Prior Function Level of Independence: Independent               Hand Dominance   Dominant Hand: Right    Extremity/Trunk  Assessment   Upper Extremity Assessment Upper Extremity Assessment: Overall WFL for tasks assessed    Lower Extremity Assessment Lower Extremity Assessment: LLE deficits/detail LLE Deficits / Details: General surgical weakness as expected s/p THA       Communication   Communication: No difficulties  Cognition Arousal/Alertness: Awake/alert Behavior During Therapy: WFL for tasks assessed/performed Overall Cognitive Status: Within Functional Limits for tasks assessed                      General Comments General comments (skin integrity, edema, etc.): Pt vomited x3 during session today upon standing (x2) and upon sitting after ambulation (x1). Pt was able to brush teeth at edge of bed. RN notified for vomiting    Exercises     Assessment/Plan    PT Assessment Patient needs continued PT services  PT Problem List Decreased strength;Decreased range of motion;Decreased activity tolerance;Decreased balance;Decreased mobility;Decreased knowledge of use of DME;Decreased safety awareness;Decreased knowledge of precautions;Pain          PT Treatment Interventions DME instruction;Gait training;Stair training;Therapeutic activities;Therapeutic exercise;Balance training;Patient/family education    PT Goals (Current goals can be found in the Care Plan section)  Acute Rehab PT Goals Patient Stated Goal: to walk PT Goal Formulation: With patient Time For Goal Achievement: 02/01/16 Potential to Achieve Goals: Good    Frequency 7X/week   Barriers to discharge        Co-evaluation               End of Session Equipment Utilized During Treatment: Gait belt Activity Tolerance: Patient limited by pain Patient left: in chair;with call bell/phone within reach Nurse Communication: Mobility status (nausea- requesting medications if possible)         Time: [id number] PT Time Calculation (min) (ACUTE ONLY): 45 min   Charges:   PT Evaluation $PT Eval Moderate  Complexity: 1 Procedure PT Treatments $Gait Training: 23-37 mins   PT G Codes:        Tonia Brooms Feb 13, 2016, 12:28 PM Tonia Brooms, SPT [phone]

## 2016-01-18 NOTE — Progress Notes (Signed)
Subjective: 1 Day Post-Op Procedure(s) (LRB): LEFT TOTAL HIP ARTHROPLASTY ANTERIOR APPROACH (Left) Patient reports pain as moderate.  Acute blood loss anemia from surgery, but tolerating well.  Objective: Vital signs in last 24 hours: Temp:  [97.7 F (36.5 C)-100.1 F (37.8 C)] 100.1 F (37.8 C) (12/13 0420) Pulse Rate:  [60-97] 97 (12/13 0420) Resp:  [13-20] 16 (12/13 0420) BP: (86-129)/(44-83) 107/65 (12/13 0420) SpO2:  [93 %-100 %] 93 % (12/13 0420) Weight:  [199 lb (90.3 kg)] 199 lb (90.3 kg) (12/12 1011)  Intake/Output from previous day: 12/12 0701 - 12/13 0700 In: 3693.8 [P.O.:720; I.V.:2373.8; IV Piggyback:100] Out: 1900 [Urine:1700; Blood:200] Intake/Output this shift: No intake/output data recorded.   Recent Labs  01/18/16 0312  HGB 10.6*    Recent Labs  01/18/16 0312  WBC 6.8  RBC 3.94  HCT 33.3*  PLT 222    Recent Labs  01/18/16 0312  NA 135  K 4.7  CL 102  CO2 28  BUN 6  CREATININE 0.74  GLUCOSE 111*  CALCIUM 8.7*   No results for input(s): LABPT, INR in the last 72 hours.  Sensation intact distally Intact pulses distally Dorsiflexion/Plantar flexion intact Incision: dressing C/D/I  Assessment/Plan: 1 Day Post-Op Procedure(s) (LRB): LEFT TOTAL HIP ARTHROPLASTY ANTERIOR APPROACH (Left) Up with therapy  Mcarthur Rossetti 01/18/2016, 7:04 AM

## 2016-01-18 NOTE — Evaluation (Addendum)
Occupational Therapy Evaluation Patient Details Name: [name] MRN: [id number] DOB: 07-06-1955 Today's Date: 01/18/2016    History of Present Illness Pt is a 60 y/o female who presented for L THA on 01/17/16 by Dr. Jean Rosenthal. 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 She had mild CAD by 2016 cath.    Clinical Impression   PTA, pt was independent with ADL, IADL, and functional mobility. Pt currently requires min assist for LB ADL and min guard to min assist for functional mobility. Pt would benefit from continued OT services while admitted to improve independence with ADL and functional mobility and to ensure a safe D/C home. Recommend D/C home with home health OT follow-up and intermittent supervision/assistance. Pt reports that she lives with her daughter who works full time but does have two sisters who live nearby and may be able to assist if needed. OT will continue to follow acutely with focus on LB ADL and shower transfers.    Follow Up Recommendations  No OT follow up;Supervision - Intermittent (would benefit from 24 hour but will not have this available)   Equipment Recommendations  3 in 1 bedside commode    Recommendations for Other Services       Precautions / Restrictions Precautions Precautions: None Restrictions Weight Bearing Restrictions: Yes LLE Weight Bearing: Weight bearing as tolerated      Mobility Bed Mobility               General bed mobility comments: Pt in chair on OT arrival  Transfers Overall transfer level: Needs assistance Equipment used: Rolling walker (2 wheeled) Transfers: Sit to/from Stand Sit to Stand: Min guard         General transfer comment: Min guard for safety    Balance Overall balance assessment: Needs  assistance Sitting-balance support: No upper extremity supported;Feet supported Sitting balance-Leahy Scale: Good     Standing balance support: No upper extremity supported;During functional activity;Bilateral upper extremity supported Standing balance-Leahy Scale: Fair Standing balance comment: Able to stand statically without UE support.                            ADL Overall ADL's : Needs assistance/impaired Eating/Feeding: Set up;Sitting   Grooming: Set up;Sitting   Upper Body Bathing: Set up;Sitting   Lower Body Bathing: Sit to/from stand;Minimal assistance   Upper Body Dressing : Set up;Sitting   Lower Body Dressing: Minimal assistance;Sit to/from stand   Toilet Transfer: Min guard;Ambulation;BSC;RW;Minimal assistance Toilet Transfer Details (indicate cue type and reason): Min guard ambulation to toilet and min assist to ambulate from toilet back to chair. Toileting- Water quality scientist and Hygiene: Min guard;Sit to/from stand       Functional mobility during ADLs: Min guard;Rolling walker General ADL Comments: Daughter is an Therapist, sports and demonstrates ability to provide necessary assistance.     Vision Vision Assessment?: No apparent visual deficits; wears contacts   Perception     Praxis      Pertinent Vitals/Pain Pain Assessment: 0-10 Pain Score: 6  Faces Pain Scale: Hurts even more Pain Location: L hip Pain Descriptors / Indicators: Aching Pain Intervention(s): Limited activity within patient's tolerance;Monitored during session;Repositioned     Hand Dominance Right   Extremity/Trunk Assessment Upper Extremity Assessment Upper Extremity Assessment: Overall WFL for tasks assessed   Lower Extremity  Assessment Lower Extremity Assessment: LLE deficits/detail LLE Deficits / Details: General surgical weakness as expected s/p THA       Communication Communication Communication: No difficulties   Cognition Arousal/Alertness:  Awake/alert Behavior During Therapy: WFL for tasks assessed/performed Overall Cognitive Status: Within Functional Limits for tasks assessed                     General Comments       Exercises Exercises: Total Joint     Shoulder Instructions      Home Living Family/patient expects to be discharged to:: Private residence Living Arrangements:  (adult daughter Investment banker, corporate and works full time); 2 sisters nearby) Available Help at Discharge: Family;Available PRN/intermittently Type of Home: House Home Access: Stairs to enter (front door 5 steps with rail B) Entrance Stairs-Number of Steps: 3 Entrance Stairs-Rails: None Home Layout: Two level (bedroom downstairs) Alternate Level Stairs-Number of Steps: full flight Alternate Level Stairs-Rails: Left;Right Bathroom Shower/Tub: Occupational psychologist: Handicapped height     Home Equipment: Environmental consultant - 2 wheels;Cane - single point;Shower seat - built in;Hand held shower head          Prior Functioning/Environment Level of Independence: Independent                 OT Problem List: Decreased strength;Decreased range of motion;Decreased activity tolerance;Impaired balance (sitting and/or standing);Decreased safety awareness;Decreased knowledge of use of DME or AE;Decreased knowledge of precautions;Pain   OT Treatment/Interventions: Self-care/ADL training;Therapeutic exercise;Energy conservation;Therapeutic activities;Patient/family education;Balance training    OT Goals(Current goals can be found in the care plan section) Acute Rehab OT Goals Patient Stated Goal: to walk OT Goal Formulation: With patient/family Time For Goal Achievement: 02/01/16 Potential to Achieve Goals: Good ADL Goals Pt Will Perform Lower Body Bathing: with modified independence;sit to/from stand;with adaptive equipment Pt Will Perform Lower Body Dressing: with modified independence;sit to/from stand;with adaptive equipment Pt Will Transfer to  Toilet: with modified independence;ambulating;bedside commode Pt Will Perform Toileting - Clothing Manipulation and hygiene: with modified independence;sit to/from stand Pt Will Perform Tub/Shower Transfer: with supervision;ambulating;Shower transfer;Tub transfer;shower seat;3 in 1;rolling walker  OT Frequency: Min 2X/week   Barriers to D/C:            Co-evaluation              End of Session Equipment Utilized During Treatment: Gait belt;Rolling walker  Activity Tolerance: Patient tolerated treatment well Patient left: in chair;with call bell/phone within reach;with family/visitor present   Time: 1135-1204 OT Time Calculation (min): 29 min Charges:  OT General Charges $OT Visit: 1 Procedure OT Evaluation $OT Eval Moderate Complexity: 1 Procedure OT Treatments $Self Care/Home Management : 8-22 mins  Norman Herrlich, OTR/L [phone] 01/18/2016, 3:30 PM

## 2016-01-18 NOTE — Progress Notes (Signed)
   01/18/16 1313  PT Visit Information  Last PT Received On 01/18/16  Assistance Needed +1  History of Present Illness Pt is a 60 y/o female who presented for L THA on 01/17/16 by Dr. Jean Rosenthal. 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 She had mild CAD by 2016 cath.   Subjective Data  Patient Stated Goal to walk  Precautions  Precautions None  Restrictions  Weight Bearing Restrictions Yes  LLE Weight Bearing WBAT  Pain Assessment  Pain Assessment 0-10  Pain Score 6  Pain Location L hip  Pain Descriptors / Indicators Aching  Pain Intervention(s) Limited activity within patient's tolerance;Monitored during session;Repositioned  Cognition  Arousal/Alertness Awake/alert  Behavior During Therapy WFL for tasks assessed/performed  Overall Cognitive Status Within Functional Limits for tasks assessed  Bed Mobility  General bed mobility comments Pt in chair on PT arrival  Transfers  Overall transfer level Needs assistance  Equipment used Rolling walker (2 wheeled)  Transfers Sit to/from Stand  Sit to Stand Supervision  General transfer comment Supervision for safety   Ambulation/Gait  Ambulation/Gait assistance Min guard  Ambulation Distance (Feet) 75 Feet  Assistive device Rolling walker (2 wheeled)  Gait Pattern/deviations Decreased stride length;Decreased stance time - left;Decreased weight shift to left;Antalgic;Step-through pattern  General Gait Details Pt continues to demonstrate antalgic gait 2/2 pain but is decreased since this AM. Pt now able to step through with ambulation. Standing rest break x1.  Gait velocity decreased  Gait velocity interpretation Below normal speed for age/gender  Balance  Overall balance assessment Needs assistance  Sitting-balance support No upper  extremity supported;Feet supported  Sitting balance-Leahy Scale Good  Standing balance support No upper extremity supported;During functional activity  Standing balance-Leahy Scale Fair  Exercises  Exercises Total Joint  Total Joint Exercises  Ankle Circles/Pumps AROM;Both;10 reps;Seated  Quad Sets AROM;Left;10 reps;Seated  Short Arc Quad AROM;Left;10 reps;Seated  Long [employer] AROM;5 reps;Left;Seated  PT - End of Session  Equipment Utilized During Treatment Gait belt  Activity Tolerance Patient limited by fatigue  Patient left in chair;with call bell/phone within reach  Nurse Communication Mobility status  PT - Assessment/Plan  PT Plan Current plan remains appropriate  PT Frequency (ACUTE ONLY) 7X/week  Follow Up Recommendations Supervision for mobility/OOB;Home health PT  PT equipment Rolling walker with 5" wheels  PT Goal Progression  Progress towards PT goals Progressing toward goals  Acute Rehab PT Goals  PT Goal Formulation With patient  Time For Goal Achievement 02/01/16  Potential to Achieve Goals Good  PT Time Calculation  PT Start Time (ACUTE ONLY) 1248  PT Stop Time (ACUTE ONLY) 1308  PT Time Calculation (min) (ACUTE ONLY) 20 min  PT General Charges  $$ ACUTE PT VISIT 1 Procedure  PT Treatments  $Therapeutic Exercise 8-22 mins   Pt able to ambulate with step through pattern and increased independence this afternoon. Pt's daughter confirms that pt's sisters will be able to assist after discharge. Pt is a good candidate for HHPT to continue to progress towards strength and mobility goals. PT will continue to follow acutely to increase independence before d/c to home.   Tonia Brooms, SPT [phone]

## 2016-01-18 NOTE — Op Note (Signed)
[name], [name]                     ACCOUNT NO.:  MEDICAL RECORD NO.:  [id number]  LOCATION:                                 FACILITY:  PHYSICIAN:  Lind Guest. Ninfa Linden, M.D.DATE OF BIRTH:  DATE OF PROCEDURE:  01/17/2016 DATE OF DISCHARGE:                              OPERATIVE REPORT   PREOPERATIVE DIAGNOSES:  Primary osteoarthritis and degenerative joint disease, left hip.  POSTOPERATIVE DIAGNOSES:  Primary osteoarthritis and degenerative joint disease, left hip.  PROCEDURE:  Left total hip arthroplasty through direct anterior approach.  IMPLANTS:  DePuy Sector Gription acetabular component size 52, size 36+ 0 neutral polyethylene liner, size 13 Corail femoral component with varus offset (KLA), size 36+ 1.5 ceramic hip ball.  SURGEON:  Lind Guest. Ninfa Linden, M.D.  ASSISTANT:  Erskine Emery, PA-C.  ANESTHESIA:  Spinal.  ANTIBIOTICS:  2 g of IV Ancef.  BLOOD LOSS:  200 mL.  COMPLICATIONS:  None.  INDICATIONS:  [name] is a very pleasant 60 year old female with known debilitating arthritis involving her left hip.  She has tried and failed all forms of conservative treatment.  Her pain is daily and it has detrimentally affected her activities of daily living, her quality of life, and her mobility.  At this point, she does wish to proceed with a total hip arthroplasty through direct anterior approach.  She understands the risk of acute blood loss anemia, nerve and vessel injury, fracture, infection, dislocation, and DVT.  She understands our goals are decreased pain, improved mobility, and overall improved quality of life.  PROCEDURE DESCRIPTION:  After informed consent was obtained, appropriate left hip was marked.  She was brought to the operating room.  Spinal anesthesia was obtained while she was on her stretcher.  She was then laid in a supine position.  A Foley catheter was placed and both feet had traction boots applied to them.  Next, she was  placed supine on the [facility]NA fracture table with the perineal post in place and both legs in inline skeletal traction devices, but no traction applied.  Her left operative hip was prepped and draped with DuraPrep and sterile drapes. Time-out was called and she was identified as correct patient, correct left hip.  We then made incision just inferior and posterior to the anterior superior iliac spine and carried this obliquely down the leg. We dissected down the tensor fascia lata muscle.  The tensor fascia was then divided longitudinally, so we could proceed with direct anterior approach to the hip.  We identified and cauterized the circumflex vessels and identified the hip capsule.  We elevated the hip capsule in an L-type format finding a moderate joint effusion and significant periarticular osteophytes.  We placed Cobra retractors around the medial and lateral femoral neck within the joint capsule and made our femoral neck cut with an oscillating saw and completed this with an osteotome. I placed a corkscrew guide in the femoral head and removed the femoral head in its entirety and found a large area devoid of cartilage completely.  We then cleaned the acetabular remnants of acetabular labrum.  I placed a bent Hohmann over the medial acetabular rim and then began  reaming under direct visualization from a size 42 reamer all the way up to a size 52 with all reamers under direct visualization and the last 2 reamers under direct fluoroscopy, so I could obtain my depth of reaming, my inclination, and anteversion.  Once I was pleased with this, I placed the real DePuy Sector Gription acetabular component size 52 and a 36+ 0 neutral polyethylene liner for that size acetabular component. Attention was then turned to the femur.  With the leg externally rotated to 120 degrees, extended and adducted, I was able to release the lateral joint capsule and used a box cutting osteotome to enter the  femoral canal and a rongeur lateralize.  I had already placed a Mueller retractor around on the medial aspect of the femur and a Hohmann retractor around the greater trochanter.  We then began broaching from a size 8 broach using the Corail broaching system up to a size 13.  With the 13 in place, we did trial a varus offset femoral neck based off her anatomy and a 36+ 1.5 hip ball.  We rolled the leg back over and up with traction and internal rotation reducing the pelvis.  We were pleased with leg length, offset, stability, and range of motion.  We then __________ assisted the entire case.  His assistance was crucial for facilitating all aspects of this case.     Lind Guest. Ninfa Linden, M.D.     CYB/MEDQ  D:  01/17/2016  T:  01/18/2016  Job:  [id number]

## 2016-01-19 LAB — CBC
HEMATOCRIT: 34.5 % — AB (ref 36.0–46.0)
Hemoglobin: 10.9 g/dL — ABNORMAL LOW (ref 12.0–15.0)
MCH: 26.8 pg (ref 26.0–34.0)
MCHC: 31.6 g/dL (ref 30.0–36.0)
MCV: 84.8 fL (ref 78.0–100.0)
PLATELETS: 212 10*3/uL (ref 150–400)
RBC: 4.07 MIL/uL (ref 3.87–5.11)
RDW: 14.9 % (ref 11.5–15.5)
WBC: 9.3 10*3/uL (ref 4.0–10.5)

## 2016-01-19 MED ORDER — OXYCODONE-ACETAMINOPHEN 5-325 MG PO TABS: 1.0000 | ORAL_TABLET | ORAL | 0 refills | Status: DC | PRN

## 2016-01-19 MED ORDER — METHOCARBAMOL 500 MG PO TABS: 500.0000 mg | ORAL_TABLET | Freq: Four times a day (QID) | ORAL | 0 refills | Status: DC | PRN

## 2016-01-19 MED ORDER — ASPIRIN 81 MG PO CHEW: 81.0000 mg | CHEWABLE_TABLET | Freq: Two times a day (BID) | ORAL | 0 refills | Status: AC

## 2016-01-19 NOTE — Discharge Instructions (Signed)

## 2016-01-19 NOTE — Progress Notes (Signed)
Occupational Therapy Treatment Patient Details Name: [name] MRN: [id number] DOB: [birth year] Today's Date: 01/19/2016    History of present illness Pt is a 60 y/o female who presented for L THA on 01/17/16 by Dr. Jean Rosenthal. 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 She had mild CAD by 2016 cath.    OT comments  Pt progressing well toward OT goals. Educated on safe shower transfers this session and pt able to complete with min assist. Pt's sister present and engaged in session and will be assisting pt at home. Continued to discuss fall prevention and use of ice for pain management. Pt would benefit from continued OT services while admitted to improve independence with ADL and functional mobility. OT will continue to follow acutely with focus on AE education for LB ADL.   Follow Up Recommendations  No OT follow up;Supervision - Intermittent    Equipment Recommendations  3 in 1 bedside commode    Recommendations for Other Services      Precautions / Restrictions Precautions Precautions: None Restrictions Weight Bearing Restrictions: Yes LLE Weight Bearing: Weight bearing as tolerated       Mobility Bed Mobility               General bed mobility comments: Received in recliner  Transfers Overall transfer level: Needs assistance Equipment used: Rolling walker (2 wheeled) Transfers: Sit to/from Stand Sit to Stand: Supervision         General transfer comment: Supervision for safety.    Balance Overall balance assessment: Needs assistance Sitting-balance support: No upper extremity supported;Feet supported Sitting balance-Leahy Scale: Good     Standing balance support: No upper extremity supported;Bilateral upper extremity supported;During functional  activity Standing balance-Leahy Scale: Fair Standing balance comment: Able to stand statically without UE support.                   ADL Overall ADL's : Needs assistance/impaired                         Toilet Transfer: Supervision/safety;Cueing for safety;Cueing for sequencing;Ambulation;BSC       Tub/ Shower Transfer: Ambulation;Rolling walker;3 in 1;Shower seat;Walk-in shower;Minimal assistance   Functional mobility during ADLs: Supervision/safety;Rolling walker General ADL Comments: Pt and sister educated on safe shower transfers and safe use of DME.       Vision                 Additional Comments: Wearing contacts   Perception     Praxis      Cognition   Behavior During Therapy: WFL for tasks assessed/performed Overall Cognitive Status: Within Functional Limits for tasks assessed                       Extremity/Trunk Assessment               Exercises Total Joint Exercises Ankle Circles/Pumps: AROM;Both;20 reps;Supine Quad Sets: AROM;Left;10 reps;Supine Short Arc Quad: AROM;Left;10 reps;Seated Heel Slides: AROM;Left;10 reps;Supine Hip ABduction/ADduction: AROM;Right;10 reps;Supine Long Arc Quad: AROM;10 reps;Seated;Left Marching in Standing: AROM;Left;10 reps;Standing   Shoulder Instructions       General Comments      Pertinent Vitals/ Pain       Pain Assessment: 0-10 Pain Score: 5  Pain Location: L hip Pain Descriptors / Indicators:  Aching;Guarding;Operative site guarding Pain Intervention(s): Limited activity within patient's tolerance;Monitored during session;Repositioned;Ice applied  Home Living                                          Prior Functioning/Environment              Frequency  Min 2X/week        Progress Toward Goals  OT Goals(current goals can now be found in the care plan section)  Progress towards OT goals: Progressing toward goals  Acute Rehab OT  Goals Patient Stated Goal: to walk OT Goal Formulation: With patient/family Time For Goal Achievement: 02/01/16 Potential to Achieve Goals: Good ADL Goals Pt Will Perform Lower Body Bathing: with modified independence;sit to/from stand;with adaptive equipment Pt Will Perform Lower Body Dressing: with modified independence;sit to/from stand;with adaptive equipment Pt Will Transfer to Toilet: with modified independence;ambulating;bedside commode Pt Will Perform Toileting - Clothing Manipulation and hygiene: with modified independence;sit to/from stand Pt Will Perform Tub/Shower Transfer: with supervision;ambulating;Shower transfer;Tub transfer;shower seat;3 in 1;rolling walker  Plan Discharge plan remains appropriate    Co-evaluation                 End of Session Equipment Utilized During Treatment: Gait belt;Rolling walker   Activity Tolerance Patient tolerated treatment well   Patient Left in chair;with call bell/phone within reach;with family/visitor present   Nurse Communication          Time: [id number] OT Time Calculation (min): 18 min  Charges: OT General Charges $OT Visit: 1 Procedure OT Treatments $Self Care/Home Management : 8-22 mins  Norman Herrlich, OTR/L [phone] 01/19/2016, 4:10 PM

## 2016-01-19 NOTE — Care Management Note (Signed)
Case Management Note  Patient Details  Name: [name] MRN: [id number] Date of Birth: Dec 03, 1955  Subjective/Objective:   Pt presented for left total Hip arthroplasty s/p day one. Pt is from home with daughter. Daughter works as a Therapist, sports but will still be able to assist at home. Pt has RW at home and stated she will not need 3n1 at this time. Per pt she has a chair height commode close to bedroom. CM did make patient aware that if she needs 3n1 at later time she can purchase from medical supply store or [employer].                  Action/Plan: CM did speak with pt in regards to [facility]. Pt had been assigned with [facility].  CM still offered choice from Agency list and pt is agreeable to [facility] for Services. Referral made and SOC to begin within 24-48 hours of d/c. No further needs from CM at this time.   Expected Discharge Date:                  Expected Discharge Plan:  [place]  In-House Referral:  NA  Discharge planning Services  CM Consult  Post Acute Care Choice:  Home Health Choice offered to:  Patient  DME Arranged:  N/A DME Agency:  NA  HH Arranged:  PT [place] Agency:  [facility] (formerly [employer])  Status of Service:  Completed, signed off  If discussed at [employer], dates discussed:    Additional Comments:  Bethena Roys, RN 01/19/2016, 11:14 AM

## 2016-01-19 NOTE — Progress Notes (Signed)
Subjective: 2 Days Post-Op Procedure(s) (LRB): LEFT TOTAL HIP ARTHROPLASTY ANTERIOR APPROACH (Left) Patient reports pain as moderate.  Good progress with therapy.  Objective: Vital signs in last 24 hours: Temp:  [98.1 F (36.7 C)-100.5 F (38.1 C)] 100.4 F (38 C) (12/14 0500) Pulse Rate:  [88-95] 95 (12/14 0500) Resp:  [16-18] 16 (12/14 0500) BP: (108-120)/(58-64) 118/58 (12/14 0500) SpO2:  [91 %-98 %] 91 % (12/14 0500)  Intake/Output from previous day: 12/13 0701 - 12/14 0700 In: 780 [P.O.:780] Out: -  Intake/Output this shift: Total I/O In: 360 [P.O.:360] Out: -    Recent Labs  01/18/16 0312 01/19/16 0541  HGB 10.6* 10.9*    Recent Labs  01/18/16 0312 01/19/16 0541  WBC 6.8 9.3  RBC 3.94 4.07  HCT 33.3* 34.5*  PLT 222 212    Recent Labs  01/18/16 0312  NA 135  K 4.7  CL 102  CO2 28  BUN 6  CREATININE 0.74  GLUCOSE 111*  CALCIUM 8.7*   No results for input(s): LABPT, INR in the last 72 hours.  Intact pulses distally Dorsiflexion/Plantar flexion intact Incision: dressing C/D/I  Assessment/Plan: 2 Days Post-Op Procedure(s) (LRB): LEFT TOTAL HIP ARTHROPLASTY ANTERIOR APPROACH (Left) Up with therapy Plan for discharge tomorrow Discharge home with home health  Mcarthur Rossetti 01/19/2016, 11:29 AM

## 2016-01-19 NOTE — Progress Notes (Signed)
Physical Therapy Treatment Patient Details Name: [name] MRN: [id number] DOB: 15-Feb-1955 Today's Date: 01/19/2016    History of Present Illness Pt is a 60 y/o female who presented for L THA on 01/17/16 by Dr. Jean Rosenthal. 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 She had mild CAD by 2016 cath.     PT Comments    Pt is POD 2 and moving better with therapy. MD sees pt briefly during this session and reports that they plan to DC home tomorrow. Performed indoor gait with mild antalgic gait noted this session x increased distance. Improved tolerance for LE exercises with no c/o nausea since yesterday.    Follow Up Recommendations  Supervision for mobility/OOB;Home health PT     Equipment Recommendations  Rolling walker with 5" wheels    Recommendations for Other Services       Precautions / Restrictions Precautions Precautions: None Restrictions Weight Bearing Restrictions: Yes LLE Weight Bearing: Weight bearing as tolerated    Mobility  Bed Mobility               General bed mobility comments: pt in chair when PT arrives  Transfers Overall transfer level: Needs assistance Equipment used: Rolling walker (2 wheeled) Transfers: Sit to/from Stand Sit to Stand: Min guard         General transfer comment: Min guard for safety  Ambulation/Gait Ambulation/Gait assistance: Min guard Ambulation Distance (Feet): 100 Feet Assistive device: Rolling walker (2 wheeled) Gait Pattern/deviations: Decreased weight shift to left;Decreased step length - right;Decreased stance time - left;Antalgic Gait velocity: decreased Gait velocity interpretation: Below normal speed for age/gender General Gait Details: Mild antalgic gait noted, requires cues for heel strike on LLE   Stairs             Wheelchair Mobility    Modified Rankin (Stroke Patients Only)       Balance Overall balance assessment: Needs assistance Sitting-balance support: No upper extremity supported;Feet supported Sitting balance-Leahy Scale: Good     Standing balance support: No upper extremity supported;During functional activity;Bilateral upper extremity supported Standing balance-Leahy Scale: Fair Standing balance comment: Able to stand statically without UE support.                    Cognition Arousal/Alertness: Awake/alert Behavior During Therapy: WFL for tasks assessed/performed Overall Cognitive Status: Within Functional Limits for tasks assessed                      Exercises Total Joint Exercises Ankle Circles/Pumps: AROM;Both;20 reps;Supine Quad Sets: AROM;Left;10 reps;Supine Short Arc Quad: AROM;Left;10 reps;Seated Heel Slides: AROM;Left;10 reps;Supine Hip ABduction/ADduction: AROM;Right;10 reps;Supine Long Arc Quad: AROM;5 reps;Left;Seated    General Comments        Pertinent Vitals/Pain Pain Assessment: 0-10 Pain Score: 5  Pain Location: L hip Pain Descriptors / Indicators: Aching;Guarding;Operative site guarding Pain Intervention(s): Monitored during session;Repositioned;Ice applied    Home Living                      Prior Function            PT Goals (current goals can now be found in the care plan section) Acute Rehab PT Goals Patient Stated Goal: to walk Progress towards PT goals: Progressing toward goals    Frequency    7X/week  PT Plan Current plan remains appropriate    Co-evaluation             End of Session Equipment Utilized During Treatment: Gait belt Activity Tolerance: Patient limited by fatigue Patient left: in chair;with call bell/phone within reach     Time: 1122-1145 PT Time Calculation (min) (ACUTE ONLY): 23 min  Charges:  $Gait Training: 8-22 mins $Therapeutic Exercise: 8-22  mins                    G Codes:      Scheryl Marten PT, DPT  [phone]  01/19/2016, 12:53 PM

## 2016-01-19 NOTE — Progress Notes (Signed)
Physical Therapy Treatment Patient Details Name: [name] MRN: [id number] DOB: Oct 07, 1955 Today's Date: 01/19/2016    History of Present Illness Pt is a 60 y/o female who presented for L THA on 01/17/16 by Dr. Jean Rosenthal. 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 She had mild CAD by 2016 cath.     PT Comments    Pt demonstrates improved tolerance for gait and strengthening exercises. Performed stair negotiation training and pt is able to perform backward stepping with good sequencing and weight bearing through LLE as compared to forward stepping with railings. Pt continues to require further services to reattempt stair negotiation.   Follow Up Recommendations  Supervision for mobility/OOB;Home health PT     Equipment Recommendations  Rolling walker with 5" wheels    Recommendations for Other Services       Precautions / Restrictions Precautions Precautions: None Restrictions Weight Bearing Restrictions: Yes LLE Weight Bearing: Weight bearing as tolerated    Mobility  Bed Mobility               General bed mobility comments: pt in chair when PT arrives  Transfers Overall transfer level: Needs assistance Equipment used: Rolling walker (2 wheeled) Transfers: Sit to/from Stand Sit to Stand: Min guard         General transfer comment: Min guard for safety  Ambulation/Gait Ambulation/Gait assistance: Min guard Ambulation Distance (Feet): 200 Feet Assistive device: Rolling walker (2 wheeled) Gait Pattern/deviations: Decreased weight shift to left;Decreased step length - right;Decreased stance time - left;Antalgic Gait velocity: decreased Gait velocity interpretation: Below normal speed for age/gender General Gait Details: Mild antalgic gait noted, requires cues for  heel strike on LLE   Stairs Stairs: Yes   Stair Management: No rails;Backwards;With walker;Step to pattern Number of Stairs: 2 General stair comments: Min A for safety to stabilize RW during backward stepping. attempted forward stepping and pt has increased difficulty wbing through LLE to perform forward stepping motion  Wheelchair Mobility    Modified Rankin (Stroke Patients Only)       Balance Overall balance assessment: Needs assistance Sitting-balance support: No upper extremity supported;Feet supported Sitting balance-Leahy Scale: Good     Standing balance support: No upper extremity supported;During functional activity;Bilateral upper extremity supported Standing balance-Leahy Scale: Fair Standing balance comment: Able to stand statically without UE support.                    Cognition Arousal/Alertness: Awake/alert Behavior During Therapy: WFL for tasks assessed/performed Overall Cognitive Status: Within Functional Limits for tasks assessed                      Exercises Total Joint Exercises Ankle Circles/Pumps: AROM;Both;20 reps;Supine Quad Sets: AROM;Left;10 reps;Supine Short Arc Quad: AROM;Left;10 reps;Seated Heel Slides: AROM;Left;10 reps;Supine Hip ABduction/ADduction: AROM;Right;10 reps;Supine Long Arc Quad: AROM;10 reps;Seated;Left Marching in Standing: AROM;Left;10 reps;Standing    General Comments        Pertinent Vitals/Pain Pain Assessment: 0-10 Pain Score: 5  Pain Location: L hip Pain Descriptors / Indicators: Aching;Guarding;Operative site guarding Pain Intervention(s): Monitored during session;Premedicated before session;Ice applied    Home Living                      Prior Function            PT Goals (  current goals can now be found in the care plan section) Acute Rehab PT Goals Patient Stated Goal: to walk Progress towards PT goals: Progressing toward goals    Frequency    7X/week      PT Plan  Current plan remains appropriate    Co-evaluation             End of Session Equipment Utilized During Treatment: Gait belt Activity Tolerance: Patient limited by fatigue Patient left: in chair;with call bell/phone within reach     Time: [id number] PT Time Calculation (min) (ACUTE ONLY): 20 min  Charges:  $Gait Training: 8-22 mins $Therapeutic Exercise: 8-22 mins                    G Codes:      Scheryl Marten PT, DPT  [phone]  01/19/2016, 3:50 PM

## 2016-01-20 LAB — CBC
HCT: 30.4 % — ABNORMAL LOW (ref 36.0–46.0)
Hemoglobin: 9.6 g/dL — ABNORMAL LOW (ref 12.0–15.0)
MCH: 27 pg (ref 26.0–34.0)
MCHC: 31.6 g/dL (ref 30.0–36.0)
MCV: 85.6 fL (ref 78.0–100.0)
PLATELETS: 198 10*3/uL (ref 150–400)
RBC: 3.55 MIL/uL — AB (ref 3.87–5.11)
RDW: 15 % (ref 11.5–15.5)
WBC: 6.6 10*3/uL (ref 4.0–10.5)

## 2016-01-20 NOTE — Progress Notes (Signed)
Patient ID: [name], female   DOB: Jan 18, 1956, 60 y.o.   MRN: [id number] Doing well.  Can be discharged to home today.

## 2016-01-20 NOTE — Progress Notes (Signed)
Physical Therapy Treatment Patient Details Name: Marbeth Rahming MRN: [id number] DOB: 07-Aug-1955 Today's Date: 01/20/2016    History of Present Illness Pt is a 60 y/o female who presented for L THA on 01/17/16 by Dr. Jean Rosenthal. 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 She had mild CAD by 2016 cath.     PT Comments    Pt presents with improved tolerance for gait distance and improved speed noted this session. Increased distance to 300'. Pt is able to tolerate weight bearing through LLE with decreased antalgia as distance increases. Pt is compliant with seated and supine exercises in HEP.    Follow Up Recommendations  Supervision for mobility/OOB;Home health PT     Equipment Recommendations  Rolling walker with 5" wheels    Recommendations for Other Services       Precautions / Restrictions Precautions Precautions: None Restrictions Weight Bearing Restrictions: Yes LLE Weight Bearing: Weight bearing as tolerated    Mobility  Bed Mobility               General bed mobility comments: Received in recliner  Transfers Overall transfer level: Needs assistance Equipment used: Rolling walker (2 wheeled) Transfers: Sit to/from Stand Sit to Stand: Supervision         General transfer comment: Supervision for safety.  Ambulation/Gait Ambulation/Gait assistance: Supervision Ambulation Distance (Feet): 300 Feet Assistive device: Rolling walker (2 wheeled) Gait Pattern/deviations: Decreased weight shift to left;Decreased step length - right;Decreased stance time - left;Antalgic Gait velocity: decreased Gait velocity interpretation: Below normal speed for age/gender General Gait Details: Mild antalgic gait noted, requires cues for heel strike on LLE   Stairs             Wheelchair Mobility    Modified Rankin (Stroke Patients Only)       Balance Overall balance assessment: Needs assistance Sitting-balance support: No upper extremity supported;Feet supported Sitting balance-Leahy Scale: Good     Standing balance support: No upper extremity supported;Bilateral upper extremity supported;During functional activity Standing balance-Leahy Scale: Fair Standing balance comment: Able to stand statically without UE support.                    Cognition Arousal/Alertness: Awake/alert Behavior During Therapy: WFL for tasks assessed/performed Overall Cognitive Status: Within Functional Limits for tasks assessed                      Exercises      General Comments        Pertinent Vitals/Pain Pain Assessment: 0-10 Pain Score: 5  Pain Location: L hip with weight bearing Pain Descriptors / Indicators: Aching;Guarding;Operative site guarding Pain Intervention(s): Monitored during session;Premedicated before session;Ice applied    Home Living                      Prior Function            PT Goals (current goals can now be found in the care plan section) Acute Rehab PT Goals Patient Stated Goal: to walk Progress towards PT goals: Progressing toward goals    Frequency    7X/week      PT Plan Current plan remains appropriate    Co-evaluation             End of Session Equipment Utilized During Treatment: Gait belt  Activity Tolerance: Patient limited by fatigue Patient left: in chair;with call bell/phone within reach     Time: 1103-1125 PT Time Calculation (min) (ACUTE ONLY): 22 min  Charges:  $Gait Training: 8-22 mins                    G Codes:      Scheryl Marten PT, DPT  [phone]  01/20/2016, 1:26 PM

## 2016-01-20 NOTE — Discharge Summary (Signed)
Patient ID: [name] MRN: [id number] DOB/AGE: [birth year] 60 y.o.  Admit date: 01/17/2016 Discharge date: 01/20/2016  Admission Diagnoses:  Principal Problem:   Unilateral primary osteoarthritis, left hip Active Problems:   Status post left hip replacement   Discharge Diagnoses:  Same  Past Medical History:  Diagnosis Date  . Anxiety    treated in the past...none now  . Arthritis   . Cancer [facility])    rectal cancer  dx 2009  . Coronary artery disease    30% LAD, RCA 10/2014 cath  . Dyspnea    with extreme exertion  . GERD (gastroesophageal reflux disease)    occasional    Surgeries: Procedure(s): LEFT TOTAL HIP ARTHROPLASTY ANTERIOR APPROACH on 01/17/2016   Consultants:   Discharged Condition: Improved  Hospital Course: Mende Ownbey is an 60 y.o. female who was admitted 01/17/2016 for operative treatment ofUnilateral primary osteoarthritis, left hip. Patient has severe unremitting pain that affects sleep, daily activities, and work/hobbies. After pre-op clearance the patient was taken to the operating room on 01/17/2016 and underwent  Procedure(s): LEFT TOTAL HIP ARTHROPLASTY ANTERIOR APPROACH.    Patient was given perioperative antibiotics: Anti-infectives    Start     Dose/Rate Route Frequency Ordered Stop   01/17/16 1930  ceFAZolin (ANCEF) IVPB 1 g/50 mL premix     1 g 100 mL/hr over 30 Minutes Intravenous Every 6 hours 01/17/16 1725 01/18/16 0412   01/17/16 1130  ceFAZolin (ANCEF) IVPB 2g/100 mL premix     2 g 200 mL/hr over 30 Minutes Intravenous To ShortStay Surgical 01/16/16 0831 01/17/16 1250       Patient was given sequential compression devices, early ambulation, and chemoprophylaxis to prevent DVT.  Patient benefited maximally from hospital stay and there were no complications.    Recent vital signs: Patient Vitals for the past 24 hrs:  BP Temp Temp src Pulse Resp SpO2  01/20/16 0500 105/63 98.4 F (36.9 C) Oral 86 16 95 %  01/19/16 1932 137/84  99.8 F (37.7 C) Oral 99 16 96 %  01/19/16 1513 (!) 101/59 99 F (37.2 C) Oral 88 16 97 %     Recent laboratory studies:  Recent Labs  01/18/16 0312 01/19/16 0541 01/20/16 0432  WBC 6.8 9.3 6.6  HGB 10.6* 10.9* 9.6*  HCT 33.3* 34.5* 30.4*  PLT 222 212 198  NA 135  --   --   K 4.7  --   --   CL 102  --   --   CO2 28  --   --   BUN 6  --   --   CREATININE 0.74  --   --   GLUCOSE 111*  --   --   CALCIUM 8.7*  --   --      Discharge Medications:   Allergies as of 01/20/2016      Reactions   No Known Allergies       Medication List    STOP taking these medications   aspirin EC 81 MG tablet Replaced by:  aspirin 81 MG chewable tablet     TAKE these medications   aspirin 81 MG chewable tablet Chew 1 tablet (81 mg total) by mouth 2 (two) times daily. Replaces:  aspirin EC 81 MG tablet   CALCIUM PO Take 1 tablet by mouth every evening.   ibuprofen 200 MG tablet Commonly known as:  ADVIL,MOTRIN Take 400 mg by mouth every 8 (eight) hours as needed (for pain.).   methocarbamol 500 MG  tablet Commonly known as:  ROBAXIN Take 1 tablet (500 mg total) by mouth every 6 (six) hours as needed for muscle spasms.   multivitamin with minerals Tabs tablet Take 1 tablet by mouth every evening.   oxyCODONE-acetaminophen 5-325 MG tablet Commonly known as:  ROXICET Take 1-2 tablets by mouth every 4 (four) hours as needed.   PROBIOTIC PO Take 1 capsule by mouth every evening.   SELENIUM PO Take 1 tablet by mouth every evening.   spironolactone 25 MG tablet Commonly known as:  ALDACTONE   VITAMIN C PO Take 1 tablet by mouth every evening.   VITAMIN D PO Take 1 tablet by mouth every evening.            Durable Medical Equipment        Start     Ordered   01/17/16 1725  DME Walker rolling  Once    Question:  Patient needs a walker to treat with the following condition  Answer:  Status post left hip replacement   01/17/16 1725   01/17/16 1725  DME 3 n 1  Once      01/17/16 1725      Diagnostic Studies: Dg C-arm 1-60 Min  Result Date: 01/17/2016 CLINICAL DATA:  Surgery. EXAM: DG C-ARM 61-120 MIN COMPARISON:  No prior. FINDINGS: Total left hip replacement. Hardware intact. Anatomic alignment. No acute abnormality . 0 minutes 25 seconds fluoroscopy. Three images obtained. IMPRESSION: Total left hip replacement with anatomic alignment . Electronically Signed   By: Marcello Moores  Register   On: 01/17/2016 13:54   Dg Hip Port Unilat With Pelvis 1v Left  Result Date: 01/17/2016 CLINICAL DATA:  Status post left hip replacement. EXAM: DG HIP (WITH OR WITHOUT PELVIS) 1V PORT LEFT COMPARISON:  Intraoperative fluoro spot images of today's date FINDINGS: The patient has undergone left total hip joint prosthesis placement. The end interface of the prosthetic components with the native bone appears normal. The surrounding soft tissues contain small amounts of air and fluid. IMPRESSION: No immediate postprocedure complication following left total hip joint prosthesis placement. Electronically Signed   By: David  Martinique M.D.   On: 01/17/2016 15:06   Dg Hip Operative Unilat W Or W/o Pelvis Left  Result Date: 01/17/2016 CLINICAL DATA:  Elective surgery. EXAM: OPERATIVE HIP (WITH PELVIS IF PERFORMED) 3 VIEWS TECHNIQUE: Fluoroscopic spot image(s) were submitted for interpretation post-operatively. COMPARISON:  No prior . FINDINGS: No acute bony or joint abnormality identified. No evidence fracture. Total left hip replacement. IMPRESSION: Total left hip replacement.  No acute bony abnormality. Electronically Signed   By: Marcello Moores  Register   On: 01/17/2016 13:53    Disposition: to home  Discharge Instructions    Call MD / Call 911    Complete by:  As directed    If you experience chest pain or shortness of breath, CALL 911 and be transported to the hospital emergency room.  If you develope a fever above 101 F, pus (white drainage) or increased drainage or redness at the wound,  or calf pain, call your surgeon's office.   Constipation Prevention    Complete by:  As directed    Drink plenty of fluids.  Prune juice may be helpful.  You may use a stool softener, such as Colace (over the counter) 100 mg twice a day.  Use MiraLax (over the counter) for constipation as needed.   Diet - low sodium heart healthy    Complete by:  As directed    Discharge  patient    Complete by:  As directed    Increase activity slowly as tolerated    Complete by:  As directed       Follow-up Information    [facility] Follow up.   Specialty:  Home Health Services Why:  Physical Therapy Contact information: [address] [place] [address] [phone]        Mcarthur Rossetti, MD Follow up in 2 week(s).   Specialty:  Orthopedic Surgery Contact information: [place] [address] [phone]            Signed: Mcarthur Rossetti 01/20/2016, 6:57 AM

## 2016-01-20 NOTE — Progress Notes (Addendum)
Occupational Therapy Treatment/Discharge Patient Details Name: [name] MRN: [id number] DOB: 1956-01-29 Today's Date: 01/20/2016    History of present illness Pt is a 60 y/o female who presented for L THA on 01/17/16 by Dr. Jean Rosenthal. 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 She had mild CAD by 2016 cath.    OT comments  Pt progressing well toward OT goals. Session focused on LB ADL and AE education. Pt able to complete dressing tasks with set-up for UB and min guard assist for LB with AE. Pt verbalizes and demonstrates understanding. Pt will have 24 hour assistance from her daughter post-acute D/C. All education completed and pt reports and demonstrates understanding of all topics concerning ADL and functional transfers from OT perspective. No further acute OT needs identified. D/C plan remains appropriate. OT will sign off.   Follow Up Recommendations  No OT follow up;Supervision - Intermittent    Equipment Recommendations  3 in 1 bedside commode       Precautions / Restrictions Precautions Precautions: None Restrictions Weight Bearing Restrictions: Yes LLE Weight Bearing: Weight bearing as tolerated       Mobility Bed Mobility               General bed mobility comments: Received in recliner  Transfers Overall transfer level: Needs assistance Equipment used: Rolling walker (2 wheeled) Transfers: Sit to/from Stand Sit to Stand: Supervision         General transfer comment: Supervision for safety.    Balance Overall balance assessment: Needs assistance Sitting-balance support: No upper extremity supported;Feet supported Sitting balance-Leahy Scale: Good     Standing balance support: No upper extremity supported;Bilateral upper extremity supported;During  functional activity Standing balance-Leahy Scale: Fair Standing balance comment: Able to stand statically without UE support.                   ADL Overall ADL's : Needs assistance/impaired                 Upper Body Dressing : Set up;Sitting   Lower Body Dressing: Min guard;With adaptive equipment;Sit to/from stand (with AE) Lower Body Dressing Details (indicate cue type and reason): Assist to align pants with reacher only. Toilet Transfer: Supervision/safety;Ambulation;BSC;RW           Functional mobility during ADLs: Supervision/safety;Rolling walker General ADL Comments: Pt educated on use of AE for LB dressing tasks as well as safety post-acute D/C.       Vision                     Perception     Praxis      Cognition   Behavior During Therapy: WFL for tasks assessed/performed Overall Cognitive Status: Within Functional Limits for tasks assessed                       Extremity/Trunk Assessment               Exercises     Shoulder Instructions       General Comments      Pertinent Vitals/ Pain       Pain Assessment: 0-10 Pain Score: 4  Pain Location: L hip Pain Descriptors / Indicators: Aching;Guarding;Operative site guarding Pain Intervention(s): Limited activity within patient's tolerance;Repositioned;Monitored during session  Home Living  Prior Functioning/Environment              Frequency  Min 2X/week        Progress Toward Goals  OT Goals(current goals can now be found in the care plan section)  Progress towards OT goals: Progressing toward goals  Acute Rehab OT Goals Patient Stated Goal: to walk OT Goal Formulation: With patient/family Time For Goal Achievement: 02/01/16 Potential to Achieve Goals: Good ADL Goals Pt Will Perform Lower Body Bathing: with modified independence;sit to/from stand;with adaptive equipment Pt Will Perform  Lower Body Dressing: with modified independence;sit to/from stand;with adaptive equipment Pt Will Transfer to Toilet: with modified independence;ambulating;bedside commode Pt Will Perform Toileting - Clothing Manipulation and hygiene: with modified independence;sit to/from stand Pt Will Perform Tub/Shower Transfer: with supervision;ambulating;Shower transfer;Tub transfer;shower seat;3 in 1;rolling walker  Plan Discharge plan remains appropriate    Co-evaluation                 End of Session Equipment Utilized During Treatment: Gait belt;Rolling walker   Activity Tolerance Patient tolerated treatment well   Patient Left in chair;with call bell/phone within reach;with family/visitor present   Nurse Communication Mobility status        Time: [phone] OT Time Calculation (min): 17 min  Charges: OT General Charges $OT Visit: 1 Procedure OT Treatments $Self Care/Home Management : 8-22 mins  Norman Herrlich, OTR/L [phone] 01/20/2016, 10:31 AM

## 2016-01-20 NOTE — Progress Notes (Signed)
Physical Therapy Treatment Patient Details Name: [name] MRN: [id number] DOB: 1955-07-13 Today's Date: 01/20/2016    History of Present Illness Pt is a 60 y/o female who presented for L THA on 01/17/16 by Dr. Jean Rosenthal. 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 She had mild CAD by 2016 cath.     PT Comments    Pt presents with improved cadence, sequencing and weight bearing through LLE with gait this session. Verbally reviewed all exercises and instructed pt on the importance of continuing with exercises once returning home and trying to ambulate 1x an hour in order to maximize functional outcomes.    Follow Up Recommendations  Supervision for mobility/OOB;Home health PT     Equipment Recommendations  Rolling walker with 5" wheels    Recommendations for Other Services       Precautions / Restrictions Precautions Precautions: None Restrictions Weight Bearing Restrictions: Yes LLE Weight Bearing: Weight bearing as tolerated    Mobility  Bed Mobility               General bed mobility comments: Received in recliner  Transfers Overall transfer level: Needs assistance Equipment used: Rolling walker (2 wheeled) Transfers: Sit to/from Stand Sit to Stand: Supervision         General transfer comment: Supervision for safety.  Ambulation/Gait Ambulation/Gait assistance: Supervision Ambulation Distance (Feet): 300 Feet Assistive device: Rolling walker (2 wheeled) Gait Pattern/deviations: Decreased weight shift to left;Decreased step length - right;Decreased stance time - left;Antalgic Gait velocity: decreased Gait velocity interpretation: Below normal speed for age/gender General Gait Details: mild antalgic gait, improved weight bearing through LLE   Stairs             Wheelchair Mobility    Modified Rankin (Stroke Patients Only)       Balance                                    Cognition Arousal/Alertness: Awake/alert Behavior During Therapy: WFL for tasks assessed/performed Overall Cognitive Status: Within Functional Limits for tasks assessed                      Exercises      General Comments        Pertinent Vitals/Pain Pain Assessment: No/denies pain Pain Score: 7  Pain Location: L hip with weight bearing Pain Descriptors / Indicators: Aching;Guarding;Operative site guarding Pain Intervention(s): Monitored during session;Premedicated before session;Ice applied    Home Living                      Prior Function            PT Goals (current goals can now be found in the care plan section) Acute Rehab PT Goals Patient Stated Goal: to walk Progress towards PT goals: Progressing toward goals    Frequency    7X/week      PT Plan Current plan remains appropriate    Co-evaluation             End of Session Equipment Utilized During Treatment: Gait belt Activity Tolerance: Patient tolerated treatment well Patient left: in chair;with call bell/phone within reach     Time: 1355-1408 PT Time Calculation (min) (ACUTE ONLY): 13 min  Charges:  $  Gait Training: 8-22 mins                    G Codes:      Scheryl Marten PT, DPT  [phone]  01/20/2016, 3:12 PM

## 2016-02-02 ENCOUNTER — Ambulatory Visit (INDEPENDENT_AMBULATORY_CARE_PROVIDER_SITE_OTHER): Payer: BLUE CROSS/BLUE SHIELD | Admitting: Orthopaedic Surgery

## 2016-02-02 DIAGNOSIS — Z96642 Presence of left artificial hip joint: Secondary | ICD-10-CM

## 2016-02-02 NOTE — Progress Notes (Signed)
The patient is 2 weeks status post a left total hip replacement. She is on a baby aspirin twice a day but was on 1 aspirin daily before this. She knows she can go back to [place] now. She is only taken Tylenol for pain and asked if she can try some naproxen as well. She is already driving as well. She reports moderate pain. She is ambulating with a cane.  On examination of her left hip incision looks good. There is no significant seroma at all. I removed the Steri-Strips and placed knee Steri-Strips. There is no evidence infection. Her leg lengths feel equal to me.  At this point she'll continue increase her activities as she tolerates. We'll see her back in a month to see how she doing overall but no x-rays are needed. She'll wean herself from the cane as she feels appropriate.

## 2016-02-09 ENCOUNTER — Telehealth (INDEPENDENT_AMBULATORY_CARE_PROVIDER_SITE_OTHER): Payer: Self-pay | Admitting: Orthopaedic Surgery

## 2016-02-09 NOTE — Telephone Encounter (Signed)
Patient called advised she is waiting for a return to work note. Patient said she emailed the form from her work to [employer] on Tuesday. The number to contact patient is [phone]

## 2016-02-09 NOTE — Telephone Encounter (Signed)
I gave this to [facility] to sign and give to you

## 2016-02-10 NOTE — Telephone Encounter (Signed)
Faxed form to [phone]

## 2016-03-01 ENCOUNTER — Ambulatory Visit (INDEPENDENT_AMBULATORY_CARE_PROVIDER_SITE_OTHER): Payer: BLUE CROSS/BLUE SHIELD | Admitting: Orthopaedic Surgery

## 2016-03-05 ENCOUNTER — Ambulatory Visit (INDEPENDENT_AMBULATORY_CARE_PROVIDER_SITE_OTHER): Payer: BLUE CROSS/BLUE SHIELD | Admitting: Orthopaedic Surgery

## 2016-03-05 ENCOUNTER — Encounter (INDEPENDENT_AMBULATORY_CARE_PROVIDER_SITE_OTHER): Payer: Self-pay

## 2016-03-05 DIAGNOSIS — Z96642 Presence of left artificial hip joint: Secondary | ICD-10-CM

## 2016-03-05 NOTE — Progress Notes (Signed)
The patient is 6 weeks status post a left total hip replacement. She is doing well. She is and living without assistive device. She has no complaints. She does wish that she is less stiff in terms of putting her shoes and socks on.  On examination of her left hip she has fluid range of motion actively and passively the left hip. Is still some stiffness but overall is doing well. She is almost completely neurovascularly intact. Her leg lengths are equal.  At this point she'll continue increase her activities. Right now I'll fill we need to put her to physical therapy as of yet because she may loosen up with time. I like see her back in 4 weeks to see how she doing overall no x-rays are needed.

## 2016-04-04 ENCOUNTER — Ambulatory Visit (INDEPENDENT_AMBULATORY_CARE_PROVIDER_SITE_OTHER): Payer: BLUE CROSS/BLUE SHIELD | Admitting: Orthopaedic Surgery

## 2016-04-04 DIAGNOSIS — Z96642 Presence of left artificial hip joint: Secondary | ICD-10-CM

## 2016-04-04 NOTE — Progress Notes (Signed)
The patient is doing very well at 75 days post a left total hip arthroplasty through direct anterior approach. She has no complaints. She does wish that she is getting less stiff with that hip in terms of putting her shoes and socks on. She denies any significant nubs and tingling.  Examination of her left hip is pretty much normal exam today other than some slight stiffness. She is not walking with any type of significant limp. She has fluid range of motion of that hip.  This point I really don't need to see her back for 6 months. I would like just a low AP pelvis at that visit.

## 2017-01-31 ENCOUNTER — Ambulatory Visit (INDEPENDENT_AMBULATORY_CARE_PROVIDER_SITE_OTHER): Payer: BLUE CROSS/BLUE SHIELD | Admitting: Orthopaedic Surgery

## 2017-05-07 ENCOUNTER — Ambulatory Visit (INDEPENDENT_AMBULATORY_CARE_PROVIDER_SITE_OTHER): Payer: BLUE CROSS/BLUE SHIELD | Admitting: Orthopaedic Surgery

## 2017-05-07 ENCOUNTER — Ambulatory Visit (INDEPENDENT_AMBULATORY_CARE_PROVIDER_SITE_OTHER): Payer: BLUE CROSS/BLUE SHIELD

## 2017-05-07 ENCOUNTER — Encounter (INDEPENDENT_AMBULATORY_CARE_PROVIDER_SITE_OTHER): Payer: Self-pay | Admitting: Orthopaedic Surgery

## 2017-05-07 DIAGNOSIS — Z96642 Presence of left artificial hip joint: Secondary | ICD-10-CM

## 2017-05-07 DIAGNOSIS — M25552 Pain in left hip: Secondary | ICD-10-CM

## 2017-05-07 MED ORDER — METHYLPREDNISOLONE 4 MG PO TABS: ORAL_TABLET | ORAL | 0 refills | Status: AC

## 2017-05-07 NOTE — Progress Notes (Signed)
Office Visit Note   Patient: [name]           Date of Birth: [birth year]           MRN: [id number] Visit Date: 05/07/2017              Requested by: Meliton Rattan, [place] [address] [place], [place] [address] PCP: Meliton Rattan, MD   Assessment & Plan: Visit Diagnoses:  1. Pain in left hip   2. Status post left hip replacement     Plan: She is Artie been on some anti-inflammatories and they are starting to bother her stomach.  She is not a diabetic some little put her on a 6-day steroid taper and gave her some samples of the topical anti-inflammatory to try on her hip.  Also showed her stretching exercises to treat trochanteric bursitis.  She is going give this some time but if it continues to bother her over the next month or so she will let us know because my next step would be a steroid injection along the lateral aspect of her hip and physical therapy.  Follow-Up Instructions: Return if symptoms worsen or fail to improve.   Orders:  Orders Placed This Encounter  Procedures  . XR HIP UNILAT W OR W/O PELVIS 1V LEFT   Meds ordered this encounter  Medications  . methylPREDNISolone (MEDROL) 4 MG tablet    Sig: Medrol dose pack. Take as instructed    Dispense:  21 tablet    Refill:  0      Procedures: No procedures performed   Clinical Data: No additional findings.   Subjective: Chief Complaint  Patient presents with  . Left Hip - Pain  The patient is 16 months status post a left total hip arthroplasty.  She is been doing well until about 2 weeks ago she was at work and noticed some pain when walking in her hip.  She points the lateral aspect of her hip and along the IT band the trochanteric area.  She denies any groin pain.  She denies any recent illnesses or recent changes in activities.  She denies any right hip pain.  HPI  Review of Systems She currently denies any fever, chills, nausea, vomiting.  She also denies any headache or chest  pain or shortness of breath  Objective: Vital Signs: There were no vitals taken for this visit.  Physical Exam She is alert x3 and in no acute distress Ortho Exam Examination of her left hip shows fluid and full range of motion with no pain in the groin at all.  She has some mild pain of the trochanteric area and IT band. Specialty Comments:  No specialty comments available.  Imaging: Xr Hip Unilat W Or W/o Pelvis 1v Left  Result Date: 05/07/2017 An AP pelvis and lateral of her left hip shows a total hip arthroplasty with no acute findings.    PMFS History: Patient Active Problem List   Diagnosis Date Noted  . Unilateral primary osteoarthritis, left hip 01/17/2016  . Status post left hip replacement 01/17/2016   Past Medical History:  Diagnosis Date  . Anxiety    treated in the past...none now  . Arthritis   . Cancer [facility])    rectal cancer  dx 2009  . Coronary artery disease    30% LAD, RCA 10/2014 cath  . Dyspnea    with extreme exertion  . GERD (gastroesophageal reflux disease)    occasional  History reviewed. No pertinent family history.  Past Surgical History:  Procedure Laterality Date  . CARDIAC CATHETERIZATION     10/22/14: 30% proximal LAD, 30% ostial RCA ([facility])  . CHOLECYSTECTOMY     thinks it was removed with cancer surgery  . COLON SURGERY     rectal cancer with removal of 2009  . DILATION AND CURETTAGE OF UTERUS    . EYE SURGERY    . LIVER RESECTION     along with rectal surgery, portion of liver was resected  . PORT A CATH insertion Right    placed back in 2009  . TOTAL HIP ARTHROPLASTY Left 01/17/2016   Procedure: LEFT TOTAL HIP ARTHROPLASTY ANTERIOR APPROACH;  Surgeon: Mcarthur Rossetti, MD;  Location: [place];  Service: Orthopedics;  Laterality: Left;   Social History   Occupational History  . Not on file  Tobacco Use  . Smoking status: Former Smoker    Packs/day: 0.50    Years: 5.00    Pack years: 2.50    Types: Cigarettes    Last  attempt to quit: 01/09/1984    Years since quitting: 33.3  . Smokeless tobacco: Never Used  Substance and Sexual Activity  . Alcohol use: Yes    Alcohol/week: 1.8 oz    Types: 3 Glasses of wine per week  . Drug use: No  . Sexual activity: Not on file

## 2017-12-28 IMAGING — RF DG C-ARM 61-120 MIN
1 series · 3 of 3 positions shown · non-contrast
Comparison: No prior.

CLINICAL DATA: Surgery.

EXAM:
DG C-ARM 61-120 MIN

[Series 1: run · 3 of 3 slices shown]
[im 1/3]
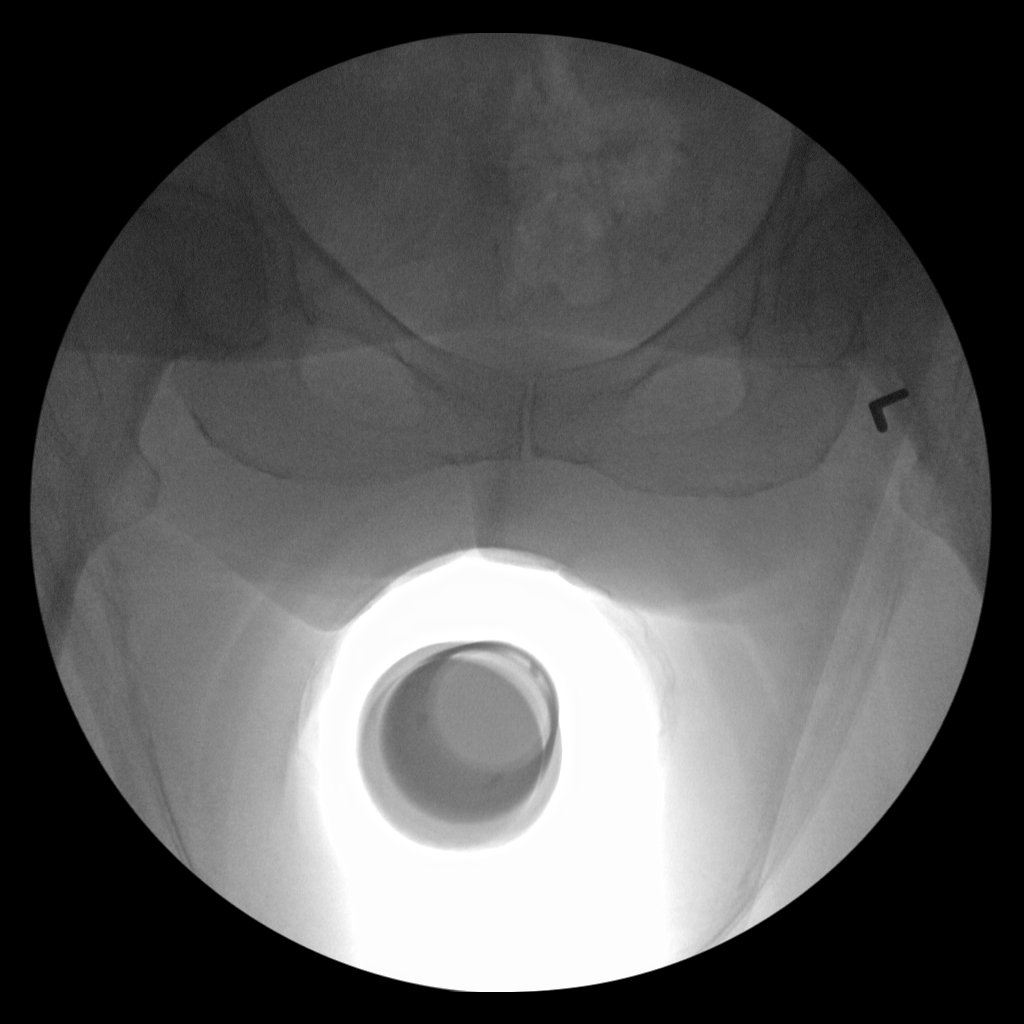
[im 2/3]
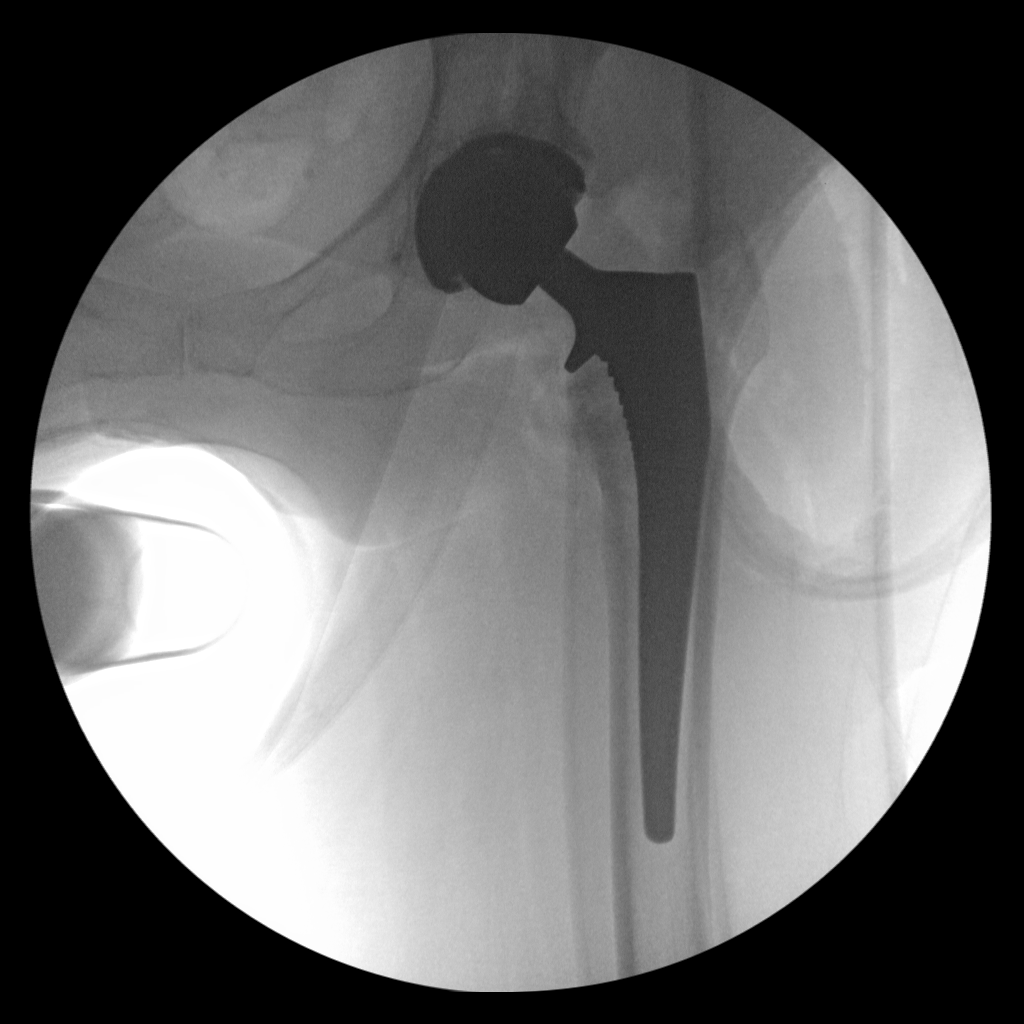
[im 3/3]
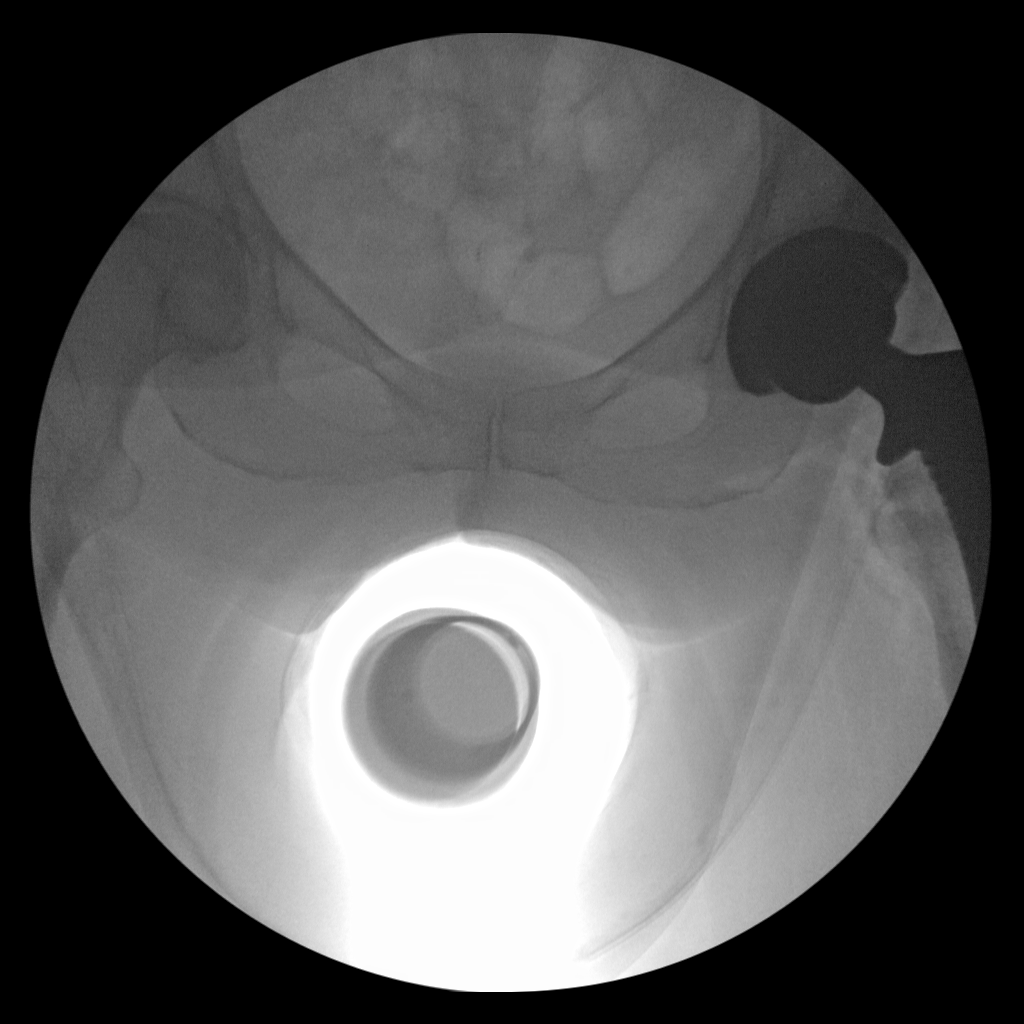

[3 of 3 positions shown; findings below may reference images not displayed]

FINDINGS: Total left hip replacement. Hardware intact. Anatomic alignment. No
acute abnormality . 0 minutes 25 seconds fluoroscopy. Three images
obtained.
IMPRESSION: Total left hip replacement with anatomic alignment .

## 2017-12-28 IMAGING — CR DG HIP (WITH OR WITHOUT PELVIS) 1V PORT*L*
3 series · 3 of 3 positions shown · non-contrast
Comparison: Intraoperative fluoro spot images of today's date

CLINICAL DATA: Status post left hip replacement.

EXAM:
DG HIP (WITH OR WITHOUT PELVIS) 1V PORT LEFT

[AP (1 of 2)]
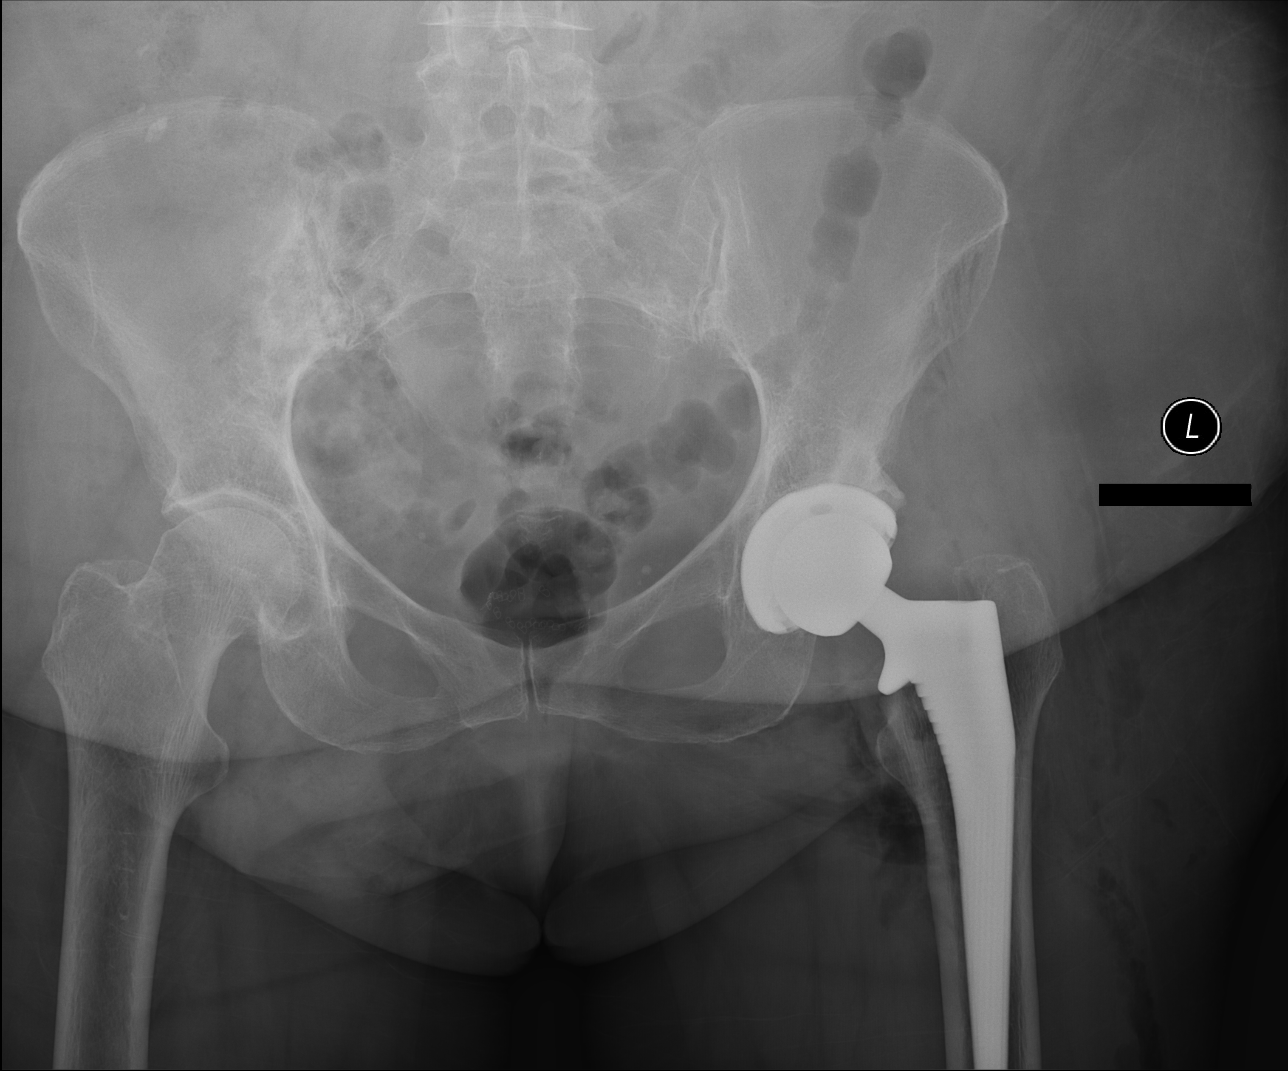

[xtable lateral]
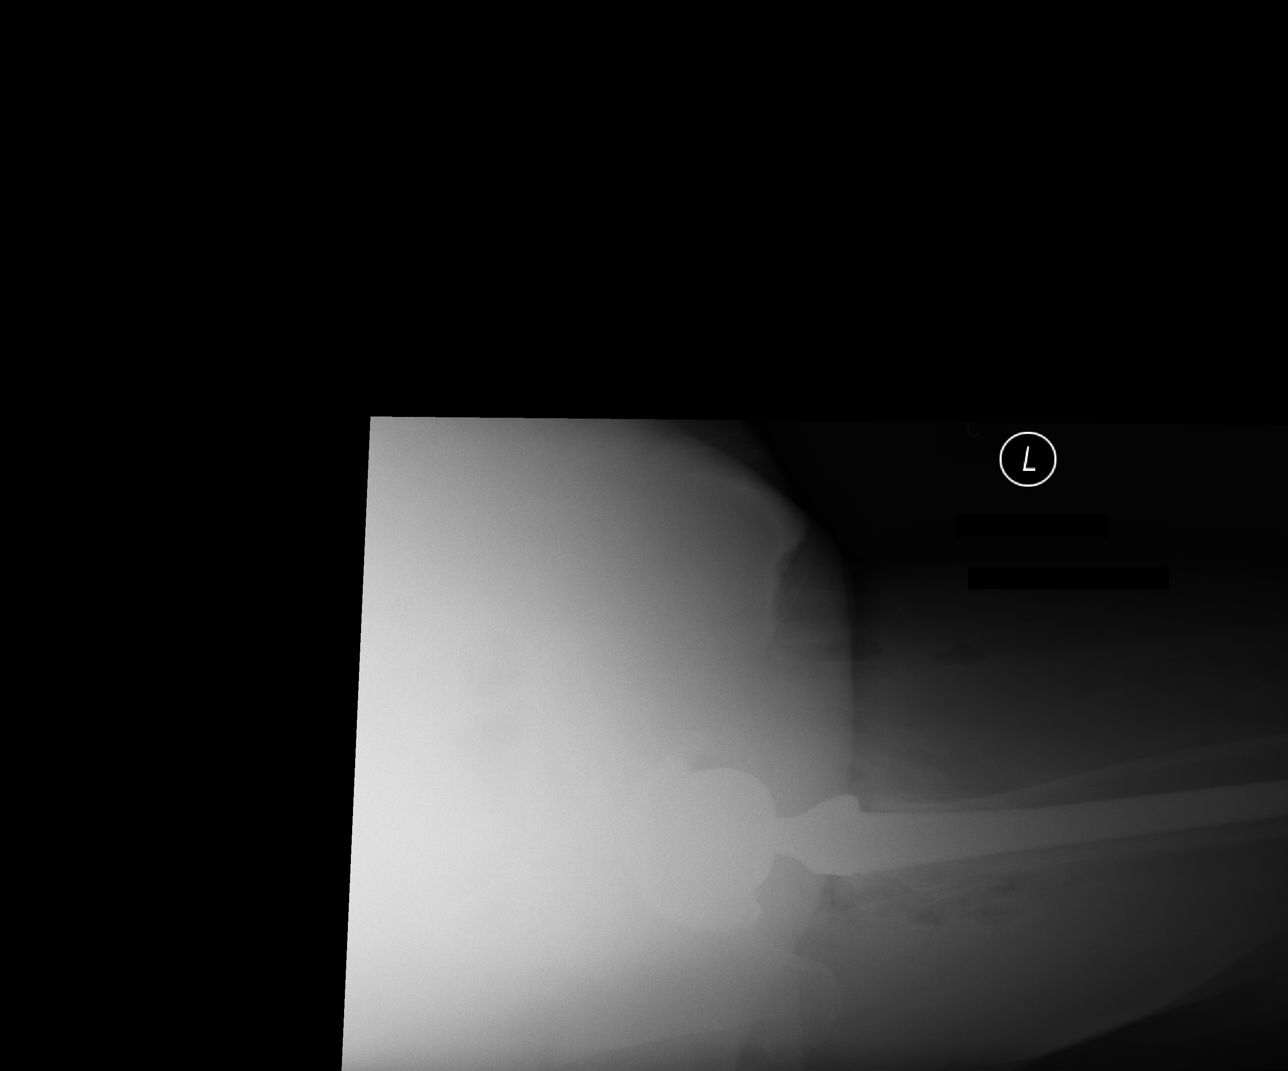

[AP (2 of 2)]
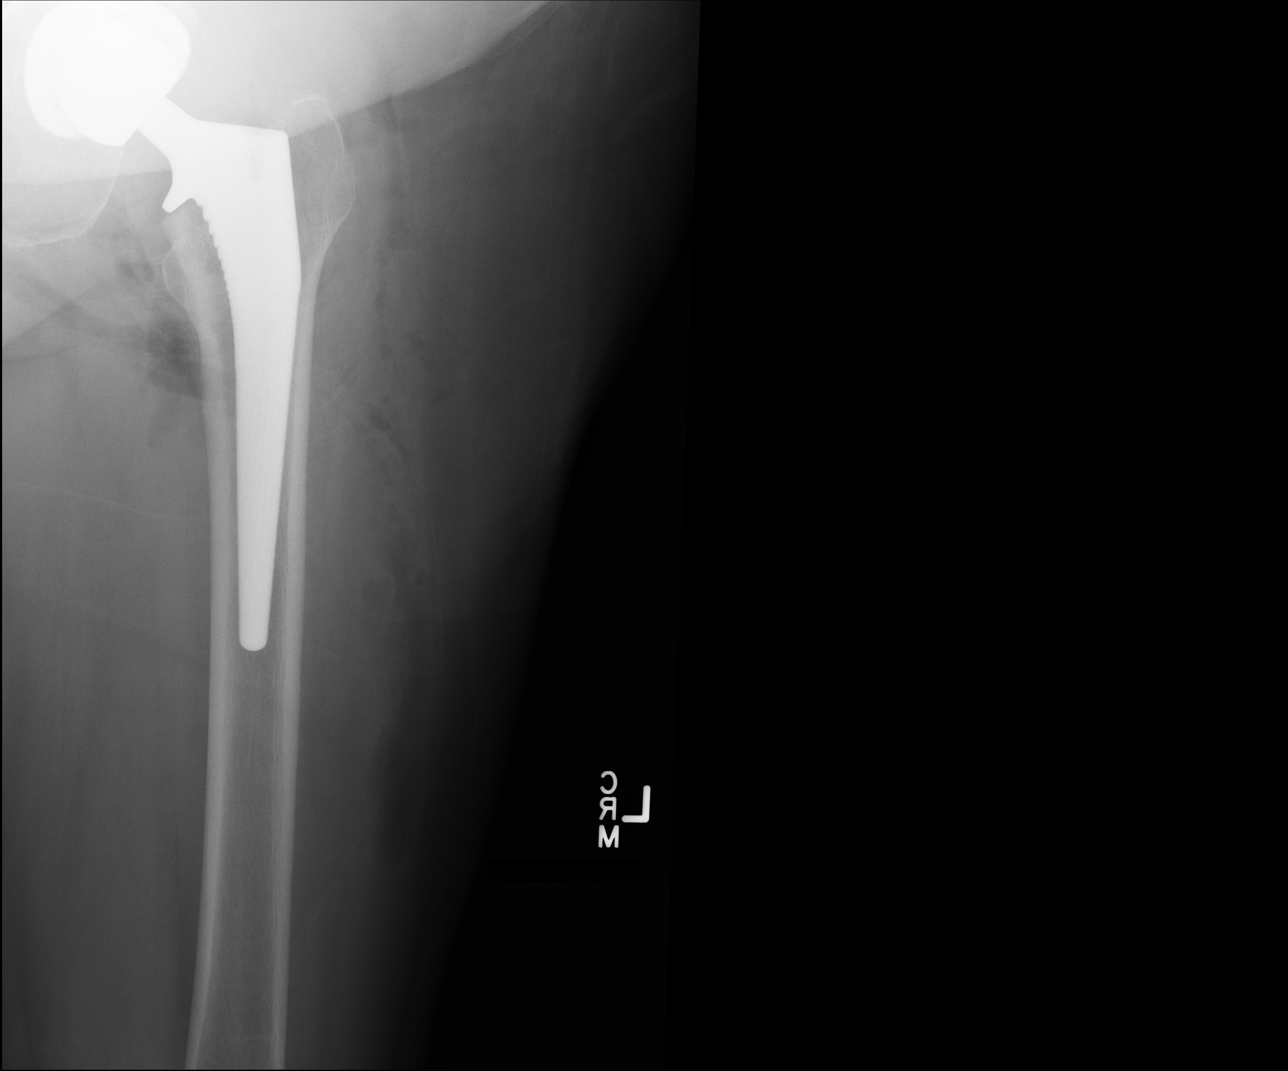

[3 of 3 positions shown; findings below may reference images not displayed]

FINDINGS: The patient has undergone left total hip joint prosthesis placement.
The end interface of the prosthetic components with the native bone
appears normal. The surrounding soft tissues contain small amounts
of air and fluid.
IMPRESSION: No immediate postprocedure complication following left total hip
joint prosthesis placement.
